# Patient Record
Sex: Female | Born: 1945 | Race: White | Hispanic: No | Marital: Single | Smoking: Former smoker
Health system: Southern US, Community
[De-identification: ages and names within clinical notes are randomized; demographics above are authoritative.]

---

## 2017-12-30 DIAGNOSIS — R7302 Impaired glucose tolerance (oral): Secondary | ICD-10-CM | POA: Insufficient documentation

## 2019-12-17 DIAGNOSIS — J439 Emphysema, unspecified: Secondary | ICD-10-CM | POA: Insufficient documentation

## 2019-12-21 ENCOUNTER — Other Ambulatory Visit: Payer: Self-pay | Admitting: Internal Medicine

## 2019-12-21 DIAGNOSIS — Z1231 Encounter for screening mammogram for malignant neoplasm of breast: Secondary | ICD-10-CM

## 2019-12-30 DIAGNOSIS — M810 Age-related osteoporosis without current pathological fracture: Secondary | ICD-10-CM | POA: Insufficient documentation

## 2020-01-25 ENCOUNTER — Ambulatory Visit
Admission: RE | Admit: 2020-01-25 | Discharge: 2020-01-25 | Disposition: A | Discharge status: Home / Self Care | Payer: Medicare Other | Source: Ambulatory Visit | Attending: Internal Medicine | Admitting: Internal Medicine

## 2020-01-25 ENCOUNTER — Encounter (INDEPENDENT_AMBULATORY_CARE_PROVIDER_SITE_OTHER): Payer: Self-pay

## 2020-01-25 ENCOUNTER — Other Ambulatory Visit: Payer: Self-pay

## 2020-01-25 DIAGNOSIS — Z1231 Encounter for screening mammogram for malignant neoplasm of breast: Secondary | ICD-10-CM | POA: Insufficient documentation

## 2020-02-17 ENCOUNTER — Other Ambulatory Visit: Payer: Self-pay | Admitting: Internal Medicine

## 2020-02-17 DIAGNOSIS — N6310 Unspecified lump in the right breast, unspecified quadrant: Secondary | ICD-10-CM

## 2020-02-17 DIAGNOSIS — R928 Other abnormal and inconclusive findings on diagnostic imaging of breast: Secondary | ICD-10-CM

## 2020-02-19 ENCOUNTER — Ambulatory Visit
Admission: RE | Admit: 2020-02-19 | Discharge: 2020-02-19 | Disposition: A | Discharge status: Home / Self Care | Payer: Medicare Other | Source: Ambulatory Visit | Attending: Internal Medicine | Admitting: Internal Medicine

## 2020-02-19 DIAGNOSIS — R928 Other abnormal and inconclusive findings on diagnostic imaging of breast: Secondary | ICD-10-CM | POA: Insufficient documentation

## 2020-02-19 DIAGNOSIS — N6310 Unspecified lump in the right breast, unspecified quadrant: Secondary | ICD-10-CM

## 2020-02-24 ENCOUNTER — Other Ambulatory Visit: Payer: Self-pay | Admitting: Internal Medicine

## 2020-02-24 DIAGNOSIS — R928 Other abnormal and inconclusive findings on diagnostic imaging of breast: Secondary | ICD-10-CM

## 2020-02-24 DIAGNOSIS — N6310 Unspecified lump in the right breast, unspecified quadrant: Secondary | ICD-10-CM

## 2020-02-26 ENCOUNTER — Ambulatory Visit
Admission: RE | Admit: 2020-02-26 | Discharge: 2020-02-26 | Disposition: A | Discharge status: Home / Self Care | Payer: Medicare Other | Source: Ambulatory Visit | Attending: Internal Medicine | Admitting: Internal Medicine

## 2020-02-26 DIAGNOSIS — R928 Other abnormal and inconclusive findings on diagnostic imaging of breast: Secondary | ICD-10-CM | POA: Insufficient documentation

## 2020-02-26 DIAGNOSIS — N6310 Unspecified lump in the right breast, unspecified quadrant: Secondary | ICD-10-CM | POA: Insufficient documentation

## 2020-02-26 HISTORY — PX: BREAST BIOPSY: SHX20

## 2020-02-29 LAB — SURGICAL PATHOLOGY

## 2020-11-03 DIAGNOSIS — F5101 Primary insomnia: Secondary | ICD-10-CM | POA: Insufficient documentation

## 2020-12-19 ENCOUNTER — Other Ambulatory Visit: Payer: Self-pay | Admitting: Internal Medicine

## 2020-12-19 DIAGNOSIS — Z1231 Encounter for screening mammogram for malignant neoplasm of breast: Secondary | ICD-10-CM

## 2021-02-06 ENCOUNTER — Ambulatory Visit
Admission: RE | Admit: 2021-02-06 | Discharge: 2021-02-06 | Disposition: A | Discharge status: Home / Self Care | Payer: Medicare Other | Source: Ambulatory Visit | Attending: Internal Medicine | Admitting: Internal Medicine

## 2021-02-06 ENCOUNTER — Other Ambulatory Visit: Payer: Self-pay

## 2021-02-06 DIAGNOSIS — Z1231 Encounter for screening mammogram for malignant neoplasm of breast: Secondary | ICD-10-CM | POA: Insufficient documentation

## 2021-03-03 ENCOUNTER — Encounter: Payer: Medicare Other | Attending: Internal Medicine | Admitting: *Deleted

## 2021-03-03 ENCOUNTER — Other Ambulatory Visit: Payer: Self-pay

## 2021-03-03 DIAGNOSIS — J9621 Acute and chronic respiratory failure with hypoxia: Secondary | ICD-10-CM | POA: Insufficient documentation

## 2021-03-03 DIAGNOSIS — J449 Chronic obstructive pulmonary disease, unspecified: Secondary | ICD-10-CM | POA: Insufficient documentation

## 2021-03-03 DIAGNOSIS — E782 Mixed hyperlipidemia: Secondary | ICD-10-CM | POA: Insufficient documentation

## 2021-03-03 NOTE — Progress Notes (Signed)
Initial telephone orientation completed. Diagnosis can be found in [facility] 5/16. EP orientation scheduled for Thursday 6/23 at 2pm.

## 2021-03-09 ENCOUNTER — Other Ambulatory Visit: Payer: Self-pay

## 2021-03-09 DIAGNOSIS — J449 Chronic obstructive pulmonary disease, unspecified: Secondary | ICD-10-CM

## 2021-03-09 NOTE — Progress Notes (Signed)
Incomplete Session Note  Patient Details  Name: Anna Mooney MRN: [id number] Date of Birth: 1946/01/11 Referring Provider:    Harrold Donath did not complete her rehab session.  Patient had on sandals and could not complete exercise session.

## 2021-03-13 ENCOUNTER — Other Ambulatory Visit: Payer: Self-pay

## 2021-03-13 VITALS — Ht 66.0 in | Wt 139.9 lb

## 2021-03-13 DIAGNOSIS — J9621 Acute and chronic respiratory failure with hypoxia: Secondary | ICD-10-CM | POA: Diagnosis not present

## 2021-03-13 DIAGNOSIS — J449 Chronic obstructive pulmonary disease, unspecified: Secondary | ICD-10-CM

## 2021-03-13 NOTE — Patient Instructions (Signed)
Patient Instructions  Patient Details  Name: Anna Mooney MRN: [id number] Date of Birth: November 15, 1945 Referring Provider:  Warnell Forester, MD  Below are your personal goals for exercise, nutrition, and risk factors. Our goal is to help you stay on track towards obtaining and maintaining these goals. We will be discussing your progress on these goals with you throughout the program.  Initial Exercise Prescription:  Initial Exercise Prescription - 03/13/21 1400       Date of Initial Exercise RX and Referring Provider   Date 03/13/21    Referring Provider Mosher      Oxygen   Oxygen Continuous    Liters 2      Treadmill   MPH 1.3    Grade 0    Minutes 15   rest as needed   METs 2      Recumbant Bike   Level 1    RPM 60    Minutes 15    METs 2      NuStep   Level 1    SPM 80    Minutes 15    METs 2      T5 Nustep   Level 1    SPM 80    Minutes 15    METs 2      Biostep-RELP   Level 1    SPM 50    Minutes 15    METs 2      Prescription Details   Frequency (times per week) 3    Duration Progress to 30 minutes of continuous aerobic without signs/symptoms of physical distress      Intensity   THRR 40-80% of Max Heartrate 131-141    Ratings of Perceived Exertion 11-13    Perceived Dyspnea 0-4      Resistance Training   Training Prescription Yes    Weight 3 lb    Reps 10-15             Exercise Goals: Frequency: Be able to perform aerobic exercise two to three times per week in program working toward 2-5 days per week of home exercise.  Intensity: Work with a perceived exertion of 11 (fairly light) - 15 (hard) while following your exercise prescription.  We will make changes to your prescription with you as you progress through the program.   Duration: Be able to do 30 to 45 minutes of continuous aerobic exercise in addition to a 5 minute warm-up and a 5 minute cool-down routine.   Nutrition Goals: Your personal nutrition goals will be established  when you do your nutrition analysis with the dietician.  The following are general nutrition guidelines to follow: Cholesterol < 200mg /day Sodium < 1500mg /day Fiber: Women under 50 yrs - 25 grams per day  Personal Goals:  Personal Goals and Risk Factors at Admission - 03/13/21 1405       Core Components/Risk Factors/Patient Goals on Admission   Improve shortness of breath with ADL's Yes    Intervention Provide education, individualized exercise plan and daily activity instruction to help decrease symptoms of SOB with activities of daily living.    Expected Outcomes Short Term: Improve cardiorespiratory fitness to achieve a reduction of symptoms when performing ADLs;Long Term: Be able to perform more ADLs without symptoms or delay the onset of symptoms    Increase knowledge of respiratory medications and ability to use respiratory devices properly  Yes    Intervention Provide education and demonstration as needed of appropriate use of medications, inhalers, and oxygen therapy.  Expected Outcomes Short Term: Achieves understanding of medications use. Understands that oxygen is a medication prescribed by physician. Demonstrates appropriate use of inhaler and oxygen therapy.;Long Term: Maintain appropriate use of medications, inhalers, and oxygen therapy.             Tobacco Use Initial Evaluation: Social History   Tobacco Use  Smoking Status Former   Pack years: 0.00   Types: Cigarettes   Quit date: 2000   Years since quitting: 22.5  Smokeless Tobacco Never    Exercise Goals and Review:  Exercise Goals     Row Name 03/13/21 1409             Exercise Goals   Increase Physical Activity Yes       Intervention Provide advice, education, support and counseling about physical activity/exercise needs.;Develop an individualized exercise prescription for aerobic and resistive training based on initial evaluation findings, risk stratification, comorbidities and participant's  personal goals.       Expected Outcomes Short Term: Attend rehab on a regular basis to increase amount of physical activity.;Long Term: Add in home exercise to make exercise part of routine and to increase amount of physical activity.;Long Term: Exercising regularly at least 3-5 days a week.       Increase Strength and Stamina Yes       Intervention Provide advice, education, support and counseling about physical activity/exercise needs.;Develop an individualized exercise prescription for aerobic and resistive training based on initial evaluation findings, risk stratification, comorbidities and participant's personal goals.       Expected Outcomes Short Term: Increase workloads from initial exercise prescription for resistance, speed, and METs.;Short Term: Perform resistance training exercises routinely during rehab and add in resistance training at home;Long Term: Improve cardiorespiratory fitness, muscular endurance and strength as measured by increased METs and functional capacity ( )       Able to understand and use rate of perceived exertion (RPE) scale Yes       Intervention Provide education and explanation on how to use RPE scale       Expected Outcomes Short Term: Able to use RPE daily in rehab to express subjective intensity level;Long Term:  Able to use RPE to guide intensity level when exercising independently       Able to understand and use Dyspnea scale Yes       Intervention Provide education and explanation on how to use Dyspnea scale       Expected Outcomes Short Term: Able to use Dyspnea scale daily in rehab to express subjective sense of shortness of breath during exertion;Long Term: Able to use Dyspnea scale to guide intensity level when exercising independently       Knowledge and understanding of Target Heart Rate Range (THRR) Yes       Intervention Provide education and explanation of THRR including how the numbers were predicted and where they are located for reference        Expected Outcomes Short Term: Able to state/look up THRR;Long Term: Able to use THRR to govern intensity when exercising independently;Short Term: Able to use daily as guideline for intensity in rehab       Able to check pulse independently Yes       Intervention Provide education and demonstration on how to check pulse in carotid and radial arteries.;Review the importance of being able to check your own pulse for safety during independent exercise       Expected Outcomes Short Term: Able to explain why pulse checking  is important during independent exercise       Understanding of Exercise Prescription Yes       Intervention Provide education, explanation, and written materials on patient's individual exercise prescription       Expected Outcomes Short Term: Able to explain program exercise prescription;Long Term: Able to explain home exercise prescription to exercise independently       Improve claudication pain toleration; Improve walking ability Yes       Intervention Participate in PAD/SET Rehab 2-3 days a week, walking at home as part of exercise prescription;Attend education sessions to aid in risk factor modification and understanding of disease process       Expected Outcomes Short Term: Improve walking distance/time to onset of claudication pain;Long Term: Improve score of PAD questionnaires                Copy of goals given to participant.

## 2021-03-13 NOTE — Progress Notes (Signed)
Daily Session Note  Patient Details  Name: Anna Mooney MRN: [id number] Date of Birth: 1945-10-29 Referring Provider:   Flowsheet Row Pulmonary Rehab from 03/13/2021 in [facility]  Referring Provider Mosher       Encounter Date: 03/13/2021  Check In:  Session Check In - 03/13/21 1417       Check-In   Supervising physician immediately available to respond to emergencies See telemetry face sheet for immediately available ER MD    Location [facility]-Cardiac & Pulmonary Rehab    Staff Present Birdie Sons, MPA, [place]icia Area, BS, ACSM CEP, Exercise Physiologist;Kara Eliezer Bottom, MS, ASCM CEP, Exercise Physiologist    Virtual Visit No    Medication changes reported     No    Fall or balance concerns reported    No    Warm-up and Cool-down Performed on first and last piece of equipment    Resistance Training Performed Yes    VAD Patient? No    PAD/SET Patient? No      Pain Assessment   Currently in Pain? No/denies               Exercise Prescription Changes - 03/13/21 1400       Response to Exercise   Blood Pressure (Admit) 140/78    Blood Pressure (Exercise) 154/84    Blood Pressure (Exit) 118/80    Heart Rate (Admit) 121 bpm    Heart Rate (Exercise) 132 bpm    Heart Rate (Exit) 121 bpm    Oxygen Saturation (Admit) 96 %    Oxygen Saturation (Exercise) 92 %    Oxygen Saturation (Exit) 96 %    Rating of Perceived Exertion (Exercise) 17    Perceived Dyspnea (Exercise) 3    Symptoms SOB             Social History   Tobacco Use  Smoking Status Former   Pack years: 0.00   Types: Cigarettes   Quit date: 2000   Years since quitting: 22.5  Smokeless Tobacco Never    Goals Met:  Independence with exercise equipment Exercise tolerated well No report of cardiac concerns or symptoms Strength training completed today  Goals Unmet:  Not Applicable  Comments: First full day of exercise!  Patient was oriented to gym and equipment including  functions, settings, policies, and procedures.  Patient's individual exercise prescription and treatment plan were reviewed.  All starting workloads were established based on the results of the 6 minute walk test done at initial orientation visit.  The plan for exercise progression was also introduced and progression will be customized based on patient's performance and goals.    Dr. Emily Filbert is Medical Director for [facility].  Dr. Ottie Glazier is Medical Director for [facility] Pulmonary [facility].

## 2021-03-13 NOTE — Progress Notes (Signed)
Pulmonary Individual Treatment Plan  Patient Details  Name: Anna Mooney MRN: [id number] Date of Birth: 1946-03-01 Referring Provider:   Flowsheet Row Pulmonary Rehab from 03/13/2021 in [facility] Cardiac and Pulmonary Rehab  Referring Provider Mosher       Initial Encounter Date:  Flowsheet Row Pulmonary Rehab from 03/13/2021 in [facility] Cardiac and Pulmonary Rehab  Date 03/13/21       Visit Diagnosis: No diagnosis found.  Patient's Home Medications on Admission:  Current Outpatient Medications:    albuterol (VENTOLIN HFA) 108 (90 Base) MCG/ACT inhaler, Inhale into the lungs., Disp: , Rfl:    Calcium Carbonate-Vitamin D 600-200 MG-UNIT TABS, Take 1 tablet by mouth daily., Disp: , Rfl:    ibuprofen (ADVIL) 200 MG tablet, Take by mouth., Disp: , Rfl:    ipratropium-albuterol (DUONEB) 0.5-2.5 (3) MG/3ML SOLN, Inhale into the lungs., Disp: , Rfl:    Melatonin 10 MG TABS, Take by mouth., Disp: , Rfl:    montelukast (SINGULAIR) 10 MG tablet, SMARTSIG:1 Tablet(s) By Mouth Every Evening, Disp: , Rfl:    TRELEGY ELLIPTA 100-62.5-25 MCG/INH AEPB, Inhale 1 puff into the lungs daily., Disp: , Rfl:   Past Medical History: No past medical history on file.  Tobacco Use: Social History   Tobacco Use  Smoking Status Former   Pack years: 0.00   Types: Cigarettes   Quit date: 2000   Years since quitting: 22.5  Smokeless Tobacco Never    Labs: Recent Review Flowsheet Data   There is no flowsheet data to display.      Pulmonary Assessment Scores:  Pulmonary Assessment Scores     Row Name 03/09/21 1513 03/13/21 1403       ADL UCSD   SOB Score total 80 --    Rest 1 --    Walk 4 --    Stairs 5 --    Bath 4 --    Dress 3 --    Shop 4 --         CAT Score      CAT Score -- 18         mMRC Score      mMRC Score -- 3            UCSD: Self-administered rating of dyspnea associated with activities of daily living (ADLs) 6-point scale (0 = "not at all" to 5 = "maximal or unable  to do because of breathlessness")  Scoring Scores range from 0 to 120.  Minimally important difference is 5 units  CAT: CAT can identify the health impairment of COPD patients and is better correlated with disease progression.  CAT has a scoring range of zero to 40. The CAT score is classified into four groups of low (less than 10), medium (10 - 20), high (21-30) and very high (31-40) based on the impact level of disease on health status. A CAT score over 10 suggests significant symptoms.  A worsening CAT score could be explained by an exacerbation, poor medication adherence, poor inhaler technique, or progression of COPD or comorbid conditions.  CAT MCID is 2 points  mMRC: mMRC (Modified Medical Research Council) Dyspnea Scale is used to assess the degree of baseline functional disability in patients of respiratory disease due to dyspnea. No minimal important difference is established. A decrease in score of 1 point or greater is considered a positive change.   Pulmonary Function Assessment:   Exercise Target Goals: Exercise Program Goal: Individual exercise prescription set using results from initial 6 min walk  test and THRR while considering  patient's activity barriers and safety.   Exercise Prescription Goal: Initial exercise prescription builds to 30-45 minutes a day of aerobic activity, 2-3 days per week.  Home exercise guidelines will be given to patient during program as part of exercise prescription that the participant will acknowledge.  Education: Aerobic Exercise: - Group verbal and visual presentation on the components of exercise prescription. Introduces F.I.T.T principle from ACSM for exercise prescriptions.  Reviews F.I.T.T. principles of aerobic exercise including progression. Written material given at graduation.   Education: Resistance Exercise: - Group verbal and visual presentation on the components of exercise prescription. Introduces F.I.T.T principle from ACSM for  exercise prescriptions  Reviews F.I.T.T. principles of resistance exercise including progression. Written material given at graduation.    Education: Exercise & Equipment Safety: - Individual verbal instruction and demonstration of equipment use and safety with use of the equipment. Flowsheet Row [facility]monary [facility]  Date 03/09/21  Educator AS  Instruction Review Code 1- Verbalizes Understanding       Education: Exercise Physiology & General Exercise Guidelines: - Group verbal and written instruction with models to review the exercise physiology of the cardiovascular system and associated critical values. Provides general exercise guidelines with specific guidelines to those with heart or lung disease.    Education: Flexibility, Balance, Mind/Body Relaxation: - Group verbal and visual presentation with interactive activity on the components of exercise prescription. Introduces F.I.T.T principle from ACSM for exercise prescriptions. Reviews F.I.T.T. principles of flexibility and balance exercise training including progression. Also discusses the mind body connection.  Reviews various relaxation techniques to help reduce and manage stress (i.e. Deep breathing, progressive muscle relaxation, and visualization). Balance handout provided to take home. Written material given at graduation.   Activity Barriers & Risk Stratification:  Activity Barriers & Cardiac Risk Stratification - 03/03/21 1307       Activity Barriers & Cardiac Risk Stratification   Activity Barriers None             6 Minute Walk:  6 Minute Walk     Row Name 03/13/21 1403         6 Minute Walk   Phase Initial     Distance 500 feet     Walk Time 4.5 minutes     # of Rest Breaks 2     MPH 1.3     METS 2     RPE 17     Perceived Dyspnea  3     VO2 Peak 7.3     Symptoms Yes (comment)     Comments SOB     Resting HR 121 bpm     Resting BP 140/78     Resting  Oxygen Saturation  96 %     Exercise Oxygen Saturation  during 6 min walk 92 %     Max Ex. HR 132 bpm     Max Ex. BP 154/84     2 Minute Post BP 118/80           Interval HR     1 Minute HR 121     2 Minute HR 129     3 Minute HR 132     4 Minute HR 127     5 Minute HR 124     6 Minute HR 126     2 Minute Post HR 121     Interval Heart Rate? Yes  Interval Oxygen     Interval Oxygen? Yes     Baseline Oxygen Saturation % 96 %     1 Minute Oxygen Saturation % 95 %     1 Minute Liters of Oxygen 2 L     2 Minute Oxygen Saturation % 93 %     2 Minute Liters of Oxygen 2 L     3 Minute Oxygen Saturation % 92 %     3 Minute Liters of Oxygen 2 L     4 Minute Oxygen Saturation % 94 %     4 Minute Liters of Oxygen 2 L     5 Minute Oxygen Saturation % 93 %     5 Minute Liters of Oxygen 2 L     6 Minute Oxygen Saturation % 92 %     6 Minute Liters of Oxygen 2 L     2 Minute Post Oxygen Saturation % 96 %     2 Minute Post Liters of Oxygen 2 L            Oxygen Initial Assessment:  Oxygen Initial Assessment - 03/03/21 1305       Home Oxygen   Home Oxygen Device Home Concentrator;Portable Concentrator    Sleep Oxygen Prescription Continuous    Liters per minute 2    Home Exercise Oxygen Prescription Continuous    Liters per minute 2    Home Resting Oxygen Prescription Continuous    Liters per minute 2    Compliance with Home Oxygen Use Yes      Intervention   Short Term Goals To learn and exhibit compliance with exercise, home and travel O2 prescription;To learn and understand importance of monitoring SPO2 with pulse oximeter and demonstrate accurate use of the pulse oximeter.;To learn and understand importance of maintaining oxygen saturations>88%;To learn and demonstrate proper pursed lip breathing techniques or other breathing techniques. ;To learn and demonstrate proper use of respiratory medications    Long  Term Goals Exhibits compliance with exercise, home  and  travel O2 prescription;Verbalizes importance of monitoring SPO2 with pulse oximeter and return demonstration;Maintenance of O2 saturations>88%;Exhibits proper breathing techniques, such as pursed lip breathing or other method taught during program session;Compliance with respiratory medication;Demonstrates proper use of MDI's             Oxygen Re-Evaluation:   Oxygen Discharge (Final Oxygen Re-Evaluation):   Initial Exercise Prescription:  Initial Exercise Prescription - 03/13/21 1400       Date of Initial Exercise RX and Referring Provider   Date 03/13/21    Referring Provider Mosher      Oxygen   Oxygen Continuous    Liters 2      Treadmill   MPH 1.3    Grade 0    Minutes 15   rest as needed   METs 2      Recumbant Bike   Level 1    RPM 60    Minutes 15    METs 2      NuStep   Level 1    SPM 80    Minutes 15    METs 2      T5 Nustep   Level 1    SPM 80    Minutes 15    METs 2      Biostep-RELP   Level 1    SPM 50    Minutes 15    METs 2      Prescription Details  Frequency (times per week) 3    Duration Progress to 30 minutes of continuous aerobic without signs/symptoms of physical distress      Intensity   THRR 40-80% of Max Heartrate 131-141    Ratings of Perceived Exertion 11-13    Perceived Dyspnea 0-4      Resistance Training   Training Prescription Yes    Weight 3 lb    Reps 10-15             Perform Capillary Blood Glucose checks as needed.  Exercise Prescription Changes:   Exercise Prescription Changes     Row Name 03/13/21 1400             Response to Exercise   Blood Pressure (Admit) 140/78       Blood Pressure (Exercise) 154/84       Blood Pressure (Exit) 118/80       Heart Rate (Admit) 121 bpm       Heart Rate (Exercise) 132 bpm       Heart Rate (Exit) 121 bpm       Oxygen Saturation (Admit) 96 %       Oxygen Saturation (Exercise) 92 %       Oxygen Saturation (Exit) 96 %       Rating of Perceived  Exertion (Exercise) 17       Perceived Dyspnea (Exercise) 3       Symptoms SOB                Exercise Comments:   Exercise Goals and Review:   Exercise Goals     Row Name 03/13/21 1409             Exercise Goals   Increase Physical Activity Yes       Intervention Provide advice, education, support and counseling about physical activity/exercise needs.;Develop an individualized exercise prescription for aerobic and resistive training based on initial evaluation findings, risk stratification, comorbidities and participant's personal goals.       Expected Outcomes Short Term: Attend rehab on a regular basis to increase amount of physical activity.;Long Term: Add in home exercise to make exercise part of routine and to increase amount of physical activity.;Long Term: Exercising regularly at least 3-5 days a week.       Increase Strength and Stamina Yes       Intervention Provide advice, education, support and counseling about physical activity/exercise needs.;Develop an individualized exercise prescription for aerobic and resistive training based on initial evaluation findings, risk stratification, comorbidities and participant's personal goals.       Expected Outcomes Short Term: Increase workloads from initial exercise prescription for resistance, speed, and METs.;Short Term: Perform resistance training exercises routinely during rehab and add in resistance training at home;Long Term: Improve cardiorespiratory fitness, muscular endurance and strength as measured by increased METs and functional capacity ( )       Able to understand and use rate of perceived exertion (RPE) scale Yes       Intervention Provide education and explanation on how to use RPE scale       Expected Outcomes Short Term: Able to use RPE daily in rehab to express subjective intensity level;Long Term:  Able to use RPE to guide intensity level when exercising independently       Able to understand and use Dyspnea  scale Yes       Intervention Provide education and explanation on how to use Dyspnea scale       Expected Outcomes  Short Term: Able to use Dyspnea scale daily in rehab to express subjective sense of shortness of breath during exertion;Long Term: Able to use Dyspnea scale to guide intensity level when exercising independently       Knowledge and understanding of Target Heart Rate Range (THRR) Yes       Intervention Provide education and explanation of THRR including how the numbers were predicted and where they are located for reference       Expected Outcomes Short Term: Able to state/look up THRR;Long Term: Able to use THRR to govern intensity when exercising independently;Short Term: Able to use daily as guideline for intensity in rehab       Able to check pulse independently Yes       Intervention Provide education and demonstration on how to check pulse in carotid and radial arteries.;Review the importance of being able to check your own pulse for safety during independent exercise       Expected Outcomes Short Term: Able to explain why pulse checking is important during independent exercise       Understanding of Exercise Prescription Yes       Intervention Provide education, explanation, and written materials on patient's individual exercise prescription       Expected Outcomes Short Term: Able to explain program exercise prescription;Long Term: Able to explain home exercise prescription to exercise independently       Improve claudication pain toleration; Improve walking ability Yes       Intervention Participate in PAD/SET Rehab 2-3 days a week, walking at home as part of exercise prescription;Attend education sessions to aid in risk factor modification and understanding of disease process       Expected Outcomes Short Term: Improve walking distance/time to onset of claudication pain;Long Term: Improve score of PAD questionnaires                Exercise Goals Re-Evaluation  :   Discharge Exercise Prescription (Final Exercise Prescription Changes):  Exercise Prescription Changes - 03/13/21 1400       Response to Exercise   Blood Pressure (Admit) 140/78    Blood Pressure (Exercise) 154/84    Blood Pressure (Exit) 118/80    Heart Rate (Admit) 121 bpm    Heart Rate (Exercise) 132 bpm    Heart Rate (Exit) 121 bpm    Oxygen Saturation (Admit) 96 %    Oxygen Saturation (Exercise) 92 %    Oxygen Saturation (Exit) 96 %    Rating of Perceived Exertion (Exercise) 17    Perceived Dyspnea (Exercise) 3    Symptoms SOB             Nutrition:  Target Goals: Understanding of nutrition guidelines, daily intake of sodium 1500mg , cholesterol 200mg , calories 30% from fat and 7% or less from saturated fats, daily to have 5 or more servings of fruits and vegetables.  Education: All About Nutrition: -Group instruction provided by verbal, written material, interactive activities, discussions, models, and posters to present general guidelines for heart healthy nutrition including fat, fiber, MyPlate, the role of sodium in heart healthy nutrition, utilization of the nutrition label, and utilization of this knowledge for meal planning. Follow up email sent as well. Written material given at graduation.   Biometrics:  Pre Biometrics - 03/13/21 1410       Pre Biometrics   Height  (1.676 m)    Weight 139 lb 14.4 oz (63.5 kg)    BMI (Calculated) 22.59    Single Leg  Stand 6.34 seconds              Nutrition Therapy Plan and Nutrition Goals:   Nutrition Assessments:  MEDIFICTS Score Key: ?70 Need to make dietary changes  40-70 Heart Healthy Diet ? 40 Therapeutic Level Cholesterol Diet  Flowsheet Row Pulmonary Rehab from 03/09/2021 in [facility] Cardiac and Pulmonary Rehab  Picture Your Plate Total Score on Admission 45      Picture Your Plate Scores: <00 Unhealthy dietary pattern with much room for improvement. 41-50 Dietary pattern unlikely to meet  recommendations for good health and room for improvement. 51-60 More healthful dietary pattern, with some room for improvement.  >60 Healthy dietary pattern, although there may be some specific behaviors that could be improved.   Nutrition Goals Re-Evaluation:   Nutrition Goals Discharge (Final Nutrition Goals Re-Evaluation):   Psychosocial: Target Goals: Acknowledge presence or absence of significant depression and/or stress, maximize coping skills, provide positive support system. Participant is able to verbalize types and ability to use techniques and skills needed for reducing stress and depression.   Education: Stress, Anxiety, and Depression - Group verbal and visual presentation to define topics covered.  Reviews how body is impacted by stress, anxiety, and depression.  Also discusses healthy ways to reduce stress and to treat/manage anxiety and depression.  Written material given at graduation.   Education: Sleep Hygiene -Provides group verbal and written instruction about how sleep can affect your health.  Define sleep hygiene, discuss sleep cycles and impact of sleep habits. Review good sleep hygiene tips.    Initial Review & Psychosocial Screening:  Initial Psych Review & Screening - 03/03/21 1312       Initial Review   Current issues with Current Stress Concerns;Current Sleep Concerns    Source of Stress Concerns Unable to participate in former interests or hobbies;Unable to perform yard/household activities      Family Dynamics   Good Support System? Yes   nieces, nephews, brother     Barriers   Psychosocial barriers to participate in program There are no identifiable barriers or psychosocial needs.      Screening Interventions   Interventions Encouraged to exercise;Provide feedback about the scores to participant;To provide support and resources with identified psychosocial needs    Expected Outcomes Short Term goal: Utilizing psychosocial counselor, staff and  physician to assist with identification of specific Stressors or current issues interfering with healing process. Setting desired goal for each stressor or current issue identified.;Long Term Goal: Stressors or current issues are controlled or eliminated.;Short Term goal: Identification and review with participant of any Quality of Life or Depression concerns found by scoring the questionnaire.;Long Term goal: The participant improves quality of Life and PHQ9 Scores as seen by post scores and/or verbalization of changes             Quality of Life Scores:  Scores of 19 and below usually indicate a poorer quality of life in these areas.  A difference of  2-3 points is a clinically meaningful difference.  A difference of 2-3 points in the total score of the Quality of Life Index has been associated with significant improvement in overall quality of life, self-image, physical symptoms, and general health in studies assessing change in quality of life.  PHQ-9: Recent Review Flowsheet Data     Depression screen [facility] 2/9 03/09/2021   Decreased Interest 1   Down, Depressed, Hopeless 0   PHQ - 2 Score 1   Altered sleeping 3   Tired,  decreased energy 3   Change in appetite 2   Feeling bad or failure about yourself  0   Trouble concentrating 0   Moving slowly or fidgety/restless 0   Suicidal thoughts 0   PHQ-9 Score 9      Interpretation of Total Score  Total Score Depression Severity:  1-4 = Minimal depression, 5-9 = Mild depression, 10-14 = Moderate depression, 15-19 = Moderately severe depression, 20-27 = Severe depression   Psychosocial Evaluation and Intervention:  Psychosocial Evaluation - 03/03/21 1319       Psychosocial Evaluation & Interventions   Interventions Encouraged to exercise with the program and follow exercise prescription    Comments Ms. Illingworth is coming to pulmonary rehab for COPD in hopes this will help her breathing. She lives alone, with nieces, nephews and a  brother close by, and wishes to maintain her independence as long as possible. She started wearing oxygen in March and is compliant with it. Her appetite is a struggle in that when she finally is hungry and makes food she only eats a little of it. She states she feels like her stress has gone down now that one of her brothers is no longer suffering with dementia- which was very difficult to watch for her. She does wish she could do more than just sit at home and is hopeful this program will change that.    Expected Outcomes Short: attend pulmonary rehab for education and exercise. Long: develop positive health care habits.             Psychosocial Re-Evaluation:   Psychosocial Discharge (Final Psychosocial Re-Evaluation):   Education: Education Goals: Education classes will be provided on a weekly basis, covering required topics. Participant will state understanding/return demonstration of topics presented.  Learning Barriers/Preferences:  Learning Barriers/Preferences - 03/03/21 1314       Learning Barriers/Preferences   Learning Barriers None    Learning Preferences None             General Pulmonary Education Topics:  Infection Prevention: - Provides verbal and written material to individual with discussion of infection control including proper hand washing and proper equipment cleaning during exercise session. Flowsheet Row Pulmonary Rehab from 03/09/2021 in [facility] Cardiac and Pulmonary Rehab  Date 03/09/21  Educator AS  Instruction Review Code 1- Verbalizes Understanding       Falls Prevention: - Provides verbal and written material to individual with discussion of falls prevention and safety.   Chronic Lung Disease Review: - Group verbal instruction with posters, models, PowerPoint presentations and videos,  to review new updates, new respiratory medications, new advancements in procedures and treatments. Providing information on websites and "800" numbers for  continued self-education. Includes information about supplement oxygen, available portable oxygen systems, continuous and intermittent flow rates, oxygen safety, concentrators, and Medicare reimbursement for oxygen. Explanation of Pulmonary Drugs, including class, frequency, complications, importance of spacers, rinsing mouth after steroid MDI's, and proper cleaning methods for nebulizers. Review of basic lung anatomy and physiology related to function, structure, and complications of lung disease. Review of risk factors. Discussion about methods for diagnosing sleep apnea and types of masks and machines for OSA. Includes a review of the use of types of environmental controls: home humidity, furnaces, filters, dust mite/pet prevention, HEPA vacuums. Discussion about weather changes, air quality and the benefits of nasal washing. Instruction on Warning signs, infection symptoms, calling MD promptly, preventive modes, and value of vaccinations. Review of effective airway clearance, coughing and/or vibration techniques. Emphasizing that  all should Create an Action Plan. Written material given at graduation.   AED/CPR: - Group verbal and written instruction with the use of models to demonstrate the basic use of the AED with the basic ABC's of resuscitation.    Anatomy and Cardiac Procedures: - Group verbal and visual presentation and models provide information about basic cardiac anatomy and function. Reviews the testing methods done to diagnose heart disease and the outcomes of the test results. Describes the treatment choices: Medical Management, Angioplasty, or Coronary Bypass Surgery for treating various heart conditions including Myocardial Infarction, Angina, Valve Disease, and Cardiac Arrhythmias.  Written material given at graduation.   Medication Safety: - Group verbal and visual instruction to review commonly prescribed medications for heart and lung disease. Reviews the medication, class of the  drug, and side effects. Includes the steps to properly store meds and maintain the prescription regimen.  Written material given at graduation.   Other: -Provides group and verbal instruction on various topics (see comments)   Knowledge Questionnaire Score:    Core Components/Risk Factors/Patient Goals at Admission:  Personal Goals and Risk Factors at Admission - 03/13/21 1405       Core Components/Risk Factors/Patient Goals on Admission   Improve shortness of breath with ADL's Yes    Intervention Provide education, individualized exercise plan and daily activity instruction to help decrease symptoms of SOB with activities of daily living.    Expected Outcomes Short Term: Improve cardiorespiratory fitness to achieve a reduction of symptoms when performing ADLs;Long Term: Be able to perform more ADLs without symptoms or delay the onset of symptoms    Increase knowledge of respiratory medications and ability to use respiratory devices properly  Yes    Intervention Provide education and demonstration as needed of appropriate use of medications, inhalers, and oxygen therapy.    Expected Outcomes Short Term: Achieves understanding of medications use. Understands that oxygen is a medication prescribed by physician. Demonstrates appropriate use of inhaler and oxygen therapy.;Long Term: Maintain appropriate use of medications, inhalers, and oxygen therapy.             Education:Diabetes - Individual verbal and written instruction to review signs/symptoms of diabetes, desired ranges of glucose level fasting, after meals and with exercise. Acknowledge that pre and post exercise glucose checks will be done for 3 sessions at entry of program.   Know Your Numbers and Heart Failure: - Group verbal and visual instruction to discuss disease risk factors for cardiac and pulmonary disease and treatment options.  Reviews associated critical values for Overweight/Obesity, Hypertension, Cholesterol, and  Diabetes.  Discusses basics of heart failure: signs/symptoms and treatments.  Introduces Heart Failure Zone chart for action plan for heart failure.  Written material given at graduation.   Core Components/Risk Factors/Patient Goals Review:    Core Components/Risk Factors/Patient Goals at Discharge (Final Review):    ITP Comments:  ITP Comments     Row Name 03/03/21 1324           ITP Comments Initial telephone orientation completed. Diagnosis can be found in [facility] 5/16. EP orientation scheduled for Thursday 6/23 at 2pm.                Comments: initial ITP

## 2021-03-15 ENCOUNTER — Other Ambulatory Visit: Payer: Self-pay

## 2021-03-15 DIAGNOSIS — J449 Chronic obstructive pulmonary disease, unspecified: Secondary | ICD-10-CM

## 2021-03-15 NOTE — Progress Notes (Signed)
Daily Session Note  Patient Details  Name: Anna Mooney MRN: [id number] Date of Birth: August 24, 1946 Referring Provider:   Flowsheet Row Pulmonary Rehab from 03/13/2021 in [facility] Cardiac and Pulmonary Rehab  Referring Provider [place]       Encounter Date: 03/15/2021  Check In:  Session Check In - 03/15/21 1354       Check-In   Supervising physician immediately available to respond to emergencies See telemetry face sheet for immediately available ER MD    Location [facility]-Cardiac & Pulmonary Rehab    Staff Present Birdie Sons, MPA, RN;Elaria Osias, RN,BC,MSN;Joseph Lou Miner, MS, ASCM CEP, Exercise Physiologist    Virtual Visit No    Medication changes reported     No    Fall or balance concerns reported    No    Warm-up and Cool-down Performed on first and last piece of equipment    Resistance Training Performed Yes    VAD Patient? No    PAD/SET Patient? No      Pain Assessment   Currently in Pain? No/denies                Social History   Tobacco Use  Smoking Status Former   Pack years: 0.00   Types: Cigarettes   Quit date: 2000   Years since quitting: 22.5  Smokeless Tobacco Never    Goals Met:  Independence with exercise equipment Exercise tolerated well No report of cardiac concerns or symptoms Strength training completed today  Goals Unmet:  Not Applicable  Comments: Pt able to follow exercise prescription today without complaint.  Will continue to monitor for progression.    Dr. Emily Filbert is Medical Director for [place].  Dr. Ottie Glazier is Medical Director for [facility] Pulmonary Rehabilitation.

## 2021-03-16 ENCOUNTER — Other Ambulatory Visit: Payer: Self-pay

## 2021-03-16 ENCOUNTER — Encounter: Payer: Medicare Other | Admitting: *Deleted

## 2021-03-16 DIAGNOSIS — J449 Chronic obstructive pulmonary disease, unspecified: Secondary | ICD-10-CM

## 2021-03-16 NOTE — Progress Notes (Signed)
Daily Session Note  Patient Details  Name: Anna Mooney MRN: [id number] Date of Birth: 09-09-1946 Referring Provider:   Flowsheet Row Pulmonary Rehab from 03/13/2021 in [facility] Cardiac and Pulmonary Rehab  Referring Provider Mosher       Encounter Date: 03/16/2021  Check In:  Session Check In - 03/16/21 1339       Check-In   Supervising physician immediately available to respond to emergencies See telemetry face sheet for immediately available ER MD    Location [facility]-Cardiac & Pulmonary Rehab    Staff Present Renita Papa, RN BSN;Joseph Lacey, RCP,RRT,BSRT;Jessica [place], [place], RCEP, CCRP, CCET    Virtual Visit No    Medication changes reported     No    Fall or balance concerns reported    No    Warm-up and Cool-down Performed on first and last piece of equipment    Resistance Training Performed Yes    VAD Patient? No    PAD/SET Patient? No      Pain Assessment   Currently in Pain? No/denies                Social History   Tobacco Use  Smoking Status Former   Pack years: 0.00   Types: Cigarettes   Quit date: 2000   Years since quitting: 22.5  Smokeless Tobacco Never    Goals Met:  Independence with exercise equipment Exercise tolerated well No report of cardiac concerns or symptoms Strength training completed today  Goals Unmet:  Not Applicable  Comments: Pt able to follow exercise prescription today without complaint.  Will continue to monitor for progression.    Dr. Emily Filbert is Medical Director for [place].  Dr. Ottie Glazier is Medical Director for [facility] Pulmonary Rehabilitation.

## 2021-03-22 ENCOUNTER — Encounter: Payer: Medicare Other | Attending: Internal Medicine

## 2021-03-22 ENCOUNTER — Other Ambulatory Visit: Payer: Self-pay

## 2021-03-22 DIAGNOSIS — J9621 Acute and chronic respiratory failure with hypoxia: Secondary | ICD-10-CM | POA: Insufficient documentation

## 2021-03-22 DIAGNOSIS — J449 Chronic obstructive pulmonary disease, unspecified: Secondary | ICD-10-CM | POA: Insufficient documentation

## 2021-03-22 NOTE — Progress Notes (Signed)
Daily Session Note  Patient Details  Name: Anna Mooney MRN: [id number] Date of Birth: [birth year] Referring Provider:   Flowsheet Row Pulmonary Rehab from 03/13/2021 in [facility] Cardiac and Pulmonary Rehab  Referring Provider Mosher       Encounter Date: 03/22/2021  Check In:  Session Check In - 03/22/21 1332       Check-In   Supervising physician immediately available to respond to emergencies See telemetry face sheet for immediately available ER MD    Location [facility]-Cardiac & Pulmonary Rehab    Staff Present Birdie Sons, MPA, Nino Glow, MS, ASCM CEP, Exercise Physiologist;Joseph Tessie Fass, [place]    Virtual Visit No    Medication changes reported     No    Fall or balance concerns reported    No    Warm-up and Cool-down Performed on first and last piece of equipment    Resistance Training Performed Yes    VAD Patient? No    PAD/SET Patient? No      Pain Assessment   Currently in Pain? No/denies                Social History   Tobacco Use  Smoking Status Former   Pack years: 0.00   Types: Cigarettes   Quit date: 2000   Years since quitting: 22.5  Smokeless Tobacco Never    Goals Met:  Independence with exercise equipment Exercise tolerated well No report of cardiac concerns or symptoms Strength training completed today  Goals Unmet:  Not Applicable  Comments: Pt able to follow exercise prescription today without complaint.  Will continue to monitor for progression.    Dr. Emily Filbert is Medical Director for [place].  Dr. Ottie Glazier is Medical Director for [facility] Pulmonary Rehabilitation.

## 2021-03-23 ENCOUNTER — Other Ambulatory Visit: Payer: Self-pay

## 2021-03-23 ENCOUNTER — Encounter: Payer: Medicare Other | Admitting: *Deleted

## 2021-03-23 DIAGNOSIS — J449 Chronic obstructive pulmonary disease, unspecified: Secondary | ICD-10-CM | POA: Diagnosis not present

## 2021-03-23 NOTE — Progress Notes (Signed)
Daily Session Note  Patient Details  Name: Anna Mooney MRN: [id number] Date of Birth: August 01, 1946 Referring Provider:   Flowsheet Row Pulmonary Rehab from 03/13/2021 in [facility] Cardiac and Pulmonary Rehab  Referring Provider Mosher       Encounter Date: 03/23/2021  Check In:  Session Check In - 03/23/21 1330       Check-In   Supervising physician immediately available to respond to emergencies See telemetry face sheet for immediately available ER MD    Location [facility]-Cardiac & Pulmonary Rehab    Staff Present Renita Papa, RN BSN;Melissa [place], RDN, LDN;Jessica [place], MA, RCEP, CCRP, Marylynn Pearson, MS, ASCM CEP, Exercise Physiologist    Virtual Visit No    Medication changes reported     No    Fall or balance concerns reported    No    Warm-up and Cool-down Performed on first and last piece of equipment    Resistance Training Performed Yes    VAD Patient? No    PAD/SET Patient? No      Pain Assessment   Currently in Pain? No/denies                Social History   Tobacco Use  Smoking Status Former   Pack years: 0.00   Types: Cigarettes   Quit date: 2000   Years since quitting: 22.5  Smokeless Tobacco Never    Goals Met:  Independence with exercise equipment Exercise tolerated well No report of cardiac concerns or symptoms Strength training completed today  Goals Unmet:  Not Applicable  Comments: Pt able to follow exercise prescription today without complaint.  Will continue to monitor for progression.    Dr. Emily Filbert is Medical Director for [place].  Dr. Ottie Glazier is Medical Director for [facility] Pulmonary Rehabilitation.

## 2021-03-27 ENCOUNTER — Encounter: Payer: Medicare Other | Admitting: *Deleted

## 2021-03-27 ENCOUNTER — Other Ambulatory Visit: Payer: Self-pay

## 2021-03-27 DIAGNOSIS — J449 Chronic obstructive pulmonary disease, unspecified: Secondary | ICD-10-CM

## 2021-03-27 NOTE — Progress Notes (Signed)
Daily Session Note  Patient Details  Name: Anna Mooney MRN: [id number] Date of Birth: 07/16/1946 Referring Provider:   Flowsheet Row Pulmonary Rehab from 03/13/2021 in [facility] Cardiac and Pulmonary Rehab  Referring Provider Mosher       Encounter Date: 03/27/2021  Check In:  Session Check In - 03/27/21 1407       Check-In   Supervising physician immediately available to respond to emergencies See telemetry face sheet for immediately available ER MD    Location [facility]-Cardiac & Pulmonary Rehab    Staff Present Birdie Sons, MPA, Mauricia Area, BS, ACSM CEP, Exercise Physiologist;Ariadne Rissmiller, RN, BSN, CCRP    Virtual Visit No    Medication changes reported     No    Fall or balance concerns reported    No    Warm-up and Cool-down Performed on first and last piece of equipment    Resistance Training Performed Yes    VAD Patient? No    PAD/SET Patient? No      Pain Assessment   Currently in Pain? No/denies                Social History   Tobacco Use  Smoking Status Former   Pack years: 0.00   Types: Cigarettes   Quit date: 2000   Years since quitting: 22.5  Smokeless Tobacco Never    Goals Met:  Proper associated with RPD/PD & O2 Sat Independence with exercise equipment Exercise tolerated well No report of cardiac concerns or symptoms  Goals Unmet:  Not Applicable  Comments: Pt able to follow exercise prescription today without complaint.  Will continue to monitor for progression.    Dr. Emily Filbert is Medical Director for [place].  Dr. Ottie Glazier is Medical Director for [facility] Pulmonary Rehabilitation.

## 2021-03-29 ENCOUNTER — Other Ambulatory Visit: Payer: Self-pay

## 2021-03-29 ENCOUNTER — Encounter: Payer: Medicare Other | Admitting: *Deleted

## 2021-03-29 ENCOUNTER — Encounter: Payer: Self-pay | Admitting: *Deleted

## 2021-03-29 DIAGNOSIS — J449 Chronic obstructive pulmonary disease, unspecified: Secondary | ICD-10-CM

## 2021-03-29 NOTE — Progress Notes (Signed)
Daily Session Note  Patient Details  Name: Wilmoth Rasnic MRN: [id number] Date of Birth: 1946-06-17 Referring Provider:   Flowsheet Row Pulmonary Rehab from 03/13/2021 in [facility] Cardiac and Pulmonary Rehab  Referring Provider Mos[place]       Encounter Date: 03/29/2021  Check In:  Session Check In - 03/29/21 1347       Check-In   Supervising physician immediately available to respond to emergencies See telemetry face sheet for immediately available ER MD    Location [facility]-Cardiac & Pulmonary Rehab    Staff Present Heath Lark, RN, BSN, Jacklynn Bue, MS, ASCM CEP, Exercise Physiologist;Kelly Rosalia Hammers, MPA, RN    Virtual Visit No    Medication changes reported     No    Fall or balance concerns reported    No    Warm-up and Cool-down Performed on first and last piece of equipment    Resistance Training Performed Yes    VAD Patient? No    PAD/SET Patient? No      Pain Assessment   Currently in Pain? No/denies                Social History   Tobacco Use  Smoking Status Former   Pack years: 0.00   Types: Cigarettes   Quit date: 2000   Years since quitting: 22.5  Smokeless Tobacco Never    Goals Met:  Proper associated with RPD/PD & O2 Sat Independence with exercise equipment Exercise tolerated well No report of cardiac concerns or symptoms  Goals Unmet:  Not Applicable  Comments: Pt able to follow exercise prescription today without complaint.  Will continue to monitor for progression.    Dr. Emily Filbert is Medical Director for [place].  Dr. Ottie Glazier is Medical Director for [facility] Pulmonary Rehabilitation.

## 2021-03-29 NOTE — Progress Notes (Signed)
Pulmonary Individual Treatment Plan  Patient Details  Name: Lamanda Rudder MRN: [id number] Date of Birth: 04/29/1946 Referring Provider:   Flowsheet Row Pulmonary Rehab from 03/13/2021 in [facility]  Referring Provider Mosher       Initial Encounter Date:  Flowsheet Row Pulmonary Rehab from 03/13/2021 in [facility] Re[facility]  Date 03/13/21       Visit Diagnosis: Chronic obstructive pulmonary disease, unspecified COPD type (HCC)  Patient's Home Medications on Admission:  Current Outpatient Medications:    albuterol (VENTOLIN HFA) 108 (90 Base) MCG/ACT inhaler, Inhale into the lungs., Disp: , Rfl:    Calcium Carbonate-Vitamin D 600-200 MG-UNIT TABS, Take 1 tablet by mouth daily., Disp: , Rfl:    ibuprofen (ADVIL) 200 MG tablet, Take by mouth., Disp: , Rfl:    ipratropium-albuterol (DUONEB) 0.5-2.5 (3) MG/3ML SOLN, Inhale into the lungs., Disp: , Rfl:    Melatonin 10 MG TABS, Take by mouth., Disp: , Rfl:    montelukast (SINGULAIR) 10 MG tablet, SMARTSIG:1 Tablet(s) By Mouth Every Evening, Disp: , Rfl:    TRELEGY ELLIPTA 100-62.5-25 MCG/INH AEPB, Inhale 1 puff into the lungs daily., Disp: , Rfl:   Past Medical History: No past medical history on file.  Tobacco Use: Social History   Tobacco Use  Smoking Status Former   Pack years: 0.00   Types: Cigarettes   Quit date: 2000   Years since quitting: 22.5  Smokeless Tobacco Never    Labs: Recent Review Flowsheet Data   There is no flowsheet data to display.      Pulmonary Assessment Scores:  Pulmonary Assessment Scores     Row Name 03/09/21 1513 03/13/21 1403       ADL UCSD   SOB Score total 80 --    Rest 1 --    Walk 4 --    Stairs 5 --    Bath 4 --    Dress 3 --    Shop 4 --         CAT Score      CAT Score -- 18         mMRC Score      mMRC Score -- 3            UCSD: Self-administered rating of dyspnea associated with activities of daily living (ADLs) 6-point  scale (0 = "not at all" to 5 = "maximal or unable to do because of breathlessness")  Scoring Scores range from 0 to 120.  Minimally important difference is 5 units  CAT: CAT can identify the health impairment of COPD patients and is better correlated with disease progression.  CAT has a scoring range of zero to 40. The CAT score is classified into four groups of low (less than 10), medium (10 - 20), high (21-30) and very high (31-40) based on the impact level of disease on health status. A CAT score over 10 suggests significant symptoms.  A worsening CAT score could be explained by an exacerbation, poor medication adherence, poor inhaler technique, or progression of COPD or comorbid conditions.  CAT MCID is 2 points  mMRC: mMRC (Modified Medical Research Council) Dyspnea Scale is used to assess the degree of baseline functional disability in patients of respiratory disease due to dyspnea. No minimal important difference is established. A decrease in score of 1 point or greater is considered a positive change.   Pulmonary Function Assessment:   Exercise Target Goals: Exercise Program Goal: Individual exercise prescription set using results  from initial 6 min walk test and THRR while considering  patient's activity barriers and safety.   Exercise Prescription Goal: Initial exercise prescription builds to 30-45 minutes a day of aerobic activity, 2-3 days per week.  Home exercise guidelines will be given to patient during program as part of exercise prescription that the participant will acknowledge.  Education: Aerobic Exercise: - Group verbal and visual presentation on the components of exercise prescription. Introduces F.I.T.T principle from ACSM for exercise prescriptions.  Reviews F.I.T.T. principles of aerobic exercise including progression. Written material given at graduation.   Education: Resistance Exercise: - Group verbal and visual presentation on the components of exercise  prescription. Introduces F.I.T.T principle from ACSM for exercise prescriptions  Reviews F.I.T.T. principles of resistance exercise including progression. Written material given at graduation. Flowsheet Row Pulmonary Rehab from 03/22/2021 in [facility] Pulmonary Rehab  Date 03/15/21  Educator [facility]  Instruction Review Code 1- Verbalizes Understanding        Education: Exercise & Equipment Safety: - Individual verbal instruction and demonstration of equipment use and safety with use of the equipment. Flowsheet Row Pulmonary Rehab from 03/22/2021 in [facility] Card[facility] and Pulmonary Rehab  Date 03/09/21  Educator [facility]  Instruction Review Code 1- Verbalizes Understanding       Education: Exercise Physiology & General Exercise Guidelines: - Group verbal and written instruction with models to review the exercise physiology of the cardiovascular system and associated critical values. Provides general exercise guidelines with specific guidelines to those with heart or lung disease.    Education: Flexibility, Balance, Mind/Body Relaxation: - Group verbal and visual presentation with interactive activity on the components of exercise prescription. Introduces F.I.T.T principle from ACSM for exercise prescriptions. Reviews F.I.T.T. principles of flexibility and balance exercise training including progression. Also discusses the mind body connection.  Reviews various relaxation techniques to help reduce and manage stress (i.e. Deep breathing, progressive muscle relaxation, and visualization). Balance handout provided to take home. Written material given at graduation. Flowsheet Row Pulmonary Rehab from 03/22/2021 in [facility] Card[facility] and Pulmonary Rehab  Date 03/22/21  Educator [facility]  Instruction Review Code 1- Verbalizes Understanding       Activity Barriers & Risk Stratification:  Activity Barriers & Cardiac Risk Stratification - 03/03/21 1307       Activity Barriers & Cardiac Risk Stratification   Activity  Barriers None             6 Minute Walk:  6 Minute Walk     Row Name 03/13/21 1403         6 Minute Walk   Phase Initial     Distance 500 feet     Walk Time 4.5 minutes     # of Rest Breaks 2     MPH 1.3     METS 2     RPE 17     Perceived Dyspnea  3     VO2 Peak 7.3     Symptoms Yes (comment)     Comments SOB     Resting HR 121 bpm     Resting BP 140/78     Resting Oxygen Saturation  96 %     Exercise Oxygen Saturation  during 6 min walk 92 %     Max Ex. HR 132 bpm     Max Ex. BP 154/84     2 Minute Post BP 118/80           Interval HR     1 Minute HR  121     2 Minute HR 129     3 Minute HR 132     4 Minute HR 127     5 Minute HR 124     6 Minute HR 126     2 Minute Post HR 121     Interval Heart Rate? Yes           Interval Oxygen     Interval Oxygen? Yes     Baseline Oxygen Saturation % 96 %     1 Minute Oxygen Saturation % 95 %     1 Minute Liters of Oxygen 2 L     2 Minute Oxygen Saturation % 93 %     2 Minute Liters of Oxygen 2 L     3 Minute Oxygen Saturation % 92 %     3 Minute Liters of Oxygen 2 L     4 Minute Oxygen Saturation % 94 %     4 Minute Liters of Oxygen 2 L     5 Minute Oxygen Saturation % 93 %     5 Minute Liters of Oxygen 2 L     6 Minute Oxygen Saturation % 92 %     6 Minute Liters of Oxygen 2 L     2 Minute Post Oxygen Saturation % 96 %     2 Minute Post Liters of Oxygen 2 L            Oxygen Initial Assessment:  Oxygen Initial Assessment - 03/03/21 1305       Home Oxygen   Home Oxygen Device Home Concentrator;Portable Concentrator    Sleep Oxygen Prescription Continuous    Liters per minute 2    Home Exercise Oxygen Prescription Continuous    Liters per minute 2    Home Resting Oxygen Prescription Continuous    Liters per minute 2    Compliance with Home Oxygen Use Yes      Intervention   Short Term Goals To learn and exhibit compliance with exercise, home and travel O2 prescription;To learn and understand  importance of monitoring SPO2 with pulse oximeter and demonstrate accurate use of the pulse oximeter.;To learn and understand importance of maintaining oxygen saturations>88%;To learn and demonstrate proper pursed lip breathing techniques or other breathing techniques. ;To learn and demonstrate proper use of respiratory medications    Long  Term Goals Exhibits compliance with exercise, home  and travel O2 prescription;Verbalizes importance of monitoring SPO2 with pulse oximeter and return demonstration;Maintenance of O2 saturations>88%;Exhibits proper breathing techniques, such as pursed lip breathing or other method taught during program session;Compliance with respiratory medication;Demonstrates proper use of MDI's             Oxygen Re-Evaluation:  Oxygen Re-Evaluation     Row Name 03/13/21 1419             Home Oxygen   Home Oxygen Device Home Concentrator;Portable Concentrator       Sleep Oxygen Prescription Continuous       Liters per minute 2       Home Exercise Oxygen Prescription Continuous       Liters per minute 2       Home Resting Oxygen Prescription Continuous       Liters per minute 2       Compliance with Home Oxygen Use Yes               Goals/Expected Outcomes     Short Term Goals  To learn and exhibit compliance with exercise, home and travel O2 prescription;To learn and understand importance of monitoring SPO2 with pulse oximeter and demonstrate accurate use of the pulse oximeter.;To learn and understand importance of maintaining oxygen saturations>88%;To learn and demonstrate proper pursed lip breathing techniques or other breathing techniques.        Long  Term Goals Exhibits compliance with exercise, home  and travel O2 prescription;Verbalizes importance of monitoring SPO2 with pulse oximeter and return demonstration;Maintenance of O2 saturations>88%;Exhibits proper breathing techniques, such as pursed lip breathing or other method taught during program session        Comments Reviewed PLB technique with pt.  Talked about how it works and it's importance in maintaining their exercise saturations.       Goals/Expected Outcomes Short: Become more profiecient at using PLB.   Long: Become independent at using PLB.               Oxygen Discharge (Final Oxygen Re-Evaluation):  Oxygen Re-Evaluation - 03/13/21 1419       Home Oxygen   Home Oxygen Device Home Concentrator;Portable Concentrator    Sleep Oxygen Prescription Continuous    Liters per minute 2    Home Exercise Oxygen Prescription Continuous    Liters per minute 2    Home Resting Oxygen Prescription Continuous    Liters per minute 2    Compliance with Home Oxygen Use Yes      Goals/Expected Outcomes   Short Term Goals To learn and exhibit compliance with exercise, home and travel O2 prescription;To learn and understand importance of monitoring SPO2 with pulse oximeter and demonstrate accurate use of the pulse oximeter.;To learn and understand importance of maintaining oxygen saturations>88%;To learn and demonstrate proper pursed lip breathing techniques or other breathing techniques.     Long  Term Goals Exhibits compliance with exercise, home  and travel O2 prescription;Verbalizes importance of monitoring SPO2 with pulse oximeter and return demonstration;Maintenance of O2 saturations>88%;Exhibits proper breathing techniques, such as pursed lip breathing or other method taught during program session    Comments Reviewed PLB technique with pt.  Talked about how it works and it's importance in maintaining their exercise saturations.    Goals/Expected Outcomes Short: Become more profiecient at using PLB.   Long: Become independent at using PLB.             Initial Exercise Prescription:  Initial Exercise Prescription - 03/13/21 1400       Date of Initial Exercise RX and Referring Provider   Date 03/13/21    Referring Provider Mosher      Oxygen   Oxygen Continuous    Liters 2       Treadmill   MPH 1.3    Grade 0    Minutes 15   rest as needed   METs 2      Recumbant Bike   Level 1    RPM 60    Minutes 15    METs 2      NuStep   Level 1    SPM 80    Minutes 15    METs 2      T5 Nustep   Level 1    SPM 80    Minutes 15    METs 2      Biostep-RELP   Level 1    SPM 50    Minutes 15    METs 2      Prescription Details   Frequency (times per week)  3    Duration Progress to 30 minutes of continuous aerobic without signs/symptoms of physical distress      Intensity   THRR 40-80% of Max Heartrate 131-141    Ratings of Perceived Exertion 11-13    Perceived Dyspnea 0-4      Resistance Training   Training Prescription Yes    Weight 3 lb    Reps 10-15             Perform Capillary Blood Glucose checks as needed.  Exercise Prescription Changes:   Exercise Prescription Changes     Row Name 03/13/21 1400 03/27/21 1000           Response to Exercise   Blood Pressure (Admit) 140/78 150/84      Blood Pressure (Exercise) 154/84 142/86      Blood Pressure (Exit) 118/80 100/60      Heart Rate (Admit) 121 bpm 102 bpm      Heart Rate (Exercise) 132 bpm 109 bpm      Heart Rate (Exit) 121 bpm 99 bpm      Oxygen Saturation (Admit) 96 % 95 %      Oxygen Saturation (Exercise) 92 % 93 %      Oxygen Saturation (Exit) 96 % 99 %      Rating of Perceived Exertion (Exercise) 17 13      Perceived Dyspnea (Exercise) 3 2      Symptoms SOB SOB      Comments -- first full day of exercise      Duration -- Progress to 30 minutes of  aerobic without signs/symptoms of physical distress      Intensity -- THRR unchanged             Progression      Progression -- Continue to progress workloads to maintain intensity without signs/symptoms of physical distress.      Average METs -- 1.77             Resistance Training      Training Prescription -- Yes      Weight -- 3 lb      Reps -- 10-15             Interval Training      Interval Training -- No              Oxygen      Oxygen -- Continuous      Liters -- 2             Treadmill      MPH -- 1.3      Grade -- 0      Minutes -- 15      METs -- 2             NuStep      Level -- 3      Minutes -- 15      METs -- 1.8              Exercise Comments:   Exercise Comments     Row Name 03/13/21 1418           Exercise Comments First full day of exercise!  Patient was oriented to gym and equipment including functions, settings, policies, and procedures.  Patient's individual exercise prescription and treatment plan were reviewed.  All starting workloads were established based on the results of the 6 minute walk test done at initial orientation visit.  The plan for exercise progression was  also introduced and progression will be customized based on patient's performance and goals.                Exercise Goals and Review:   Exercise Goals     Row Name 03/13/21 1409             Exercise Goals   Increase Physical Activity Yes       Intervention Provide advice, education, support and counseling about physical activity/exercise needs.;Develop an individualized exercise prescription for aerobic and resistive training based on initial evaluation findings, risk stratification, comorbidities and participant's personal goals.       Expected Outcomes Short Term: Attend rehab on a regular basis to increase amount of physical activity.;Long Term: Add in home exercise to make exercise part of routine and to increase amount of physical activity.;Long Term: Exercising regularly at least 3-5 days a week.       Increase Strength and Stamina Yes       Intervention Provide advice, education, support and counseling about physical activity/exercise needs.;Develop an individualized exercise prescription for aerobic and resistive training based on initial evaluation findings, risk stratification, comorbidities and participant's personal goals.       Expected Outcomes Short Term: Increase  workloads from initial exercise prescription for resistance, speed, and METs.;Short Term: Perform resistance training exercises routinely during rehab and add in resistance training at home;Long Term: Improve cardiorespiratory fitness, muscular endurance and strength as measured by increased METs and functional capacity (6MWT)       Able to understand and use rate of perceived exertion (RPE) scale Yes       Intervention Provide education and explanation on how to use RPE scale       Expected Outcomes Short Term: Able to use RPE daily in rehab to express subjective intensity level;Long Term:  Able to use RPE to guide intensity level when exercising independently       Able to understand and use Dyspnea scale Yes       Intervention Provide education and explanation on how to use Dyspnea scale       Expected Outcomes Short Term: Able to use Dyspnea scale daily in rehab to express subjective sense of shortness of breath during exertion;Long Term: Able to use Dyspnea scale to guide intensity level when exercising independently       Knowledge and understanding of Target Heart Rate Range (THRR) Yes       Intervention Provide education and explanation of THRR including how the numbers were predicted and where they are located for reference       Expected Outcomes Short Term: Able to state/look up THRR;Long Term: Able to use THRR to govern intensity when exercising independently;Short Term: Able to use daily as guideline for intensity in rehab       Able to check pulse independently Yes       Intervention Provide education and demonstration on how to check pulse in carotid and radial arteries.;Review the importance of being able to check your own pulse for safety during independent exercise       Expected Outcomes Short Term: Able to explain why pulse checking is important during independent exercise       Understanding of Exercise Prescription Yes       Intervention Provide education, explanation, and written  materials on patient's individual exercise prescription       Expected Outcomes Short Term: Able to explain program exercise prescription;Long Term: Able to explain home exercise prescription to exercise independently  Improve claudication pain toleration; Improve walking ability Yes       Intervention Participate in PAD/SET Rehab 2-3 days a week, walking at home as part of exercise prescription;Attend education sessions to aid in risk factor modification and understanding of disease process       Expected Outcomes Short Term: Improve walking distance/time to onset of claudication pain;Long Term: Improve score of PAD questionnaires                Exercise Goals Re-Evaluation :  Exercise Goals Re-Evaluation     Row Name 03/13/21 1418 03/27/21 1012           Exercise Goal Re-Evaluation   Exercise Goals Review Increase Physical Activity;Knowledge and understanding of Target Heart Rate Range (THRR);Able to understand and use rate of perceived exertion (RPE) scale;Understanding of Exercise Prescription;Increase Strength and Stamina;Able to understand and use Dyspnea scale;Able to check pulse independently Increase Physical Activity;Increase Strength and Stamina      Comments Reviewed RPE and dyspnea scales, THR and program prescription with pt today.  Pt voiced understanding and was given a copy of goals to take home. Lyne is doing well her first couple of weeks of being at rehab. She has already increased to level 3 on the T4 NuStep. She is building up tolerance on the treadmill and oxygen saturations are staying above 88% during exercise. Will continue to monitor.      Expected Outcomes Short: Use RPE daily to regulate intensity. Long: Follow program prescription in THR. Short: Continue to increase speed on treadmill Long: Continue to increase overall strength and stamina               Discharge Exercise Prescription (Final Exercise Prescription Changes):  Exercise Prescription  Changes - 03/27/21 1000       Response to Exercise   Blood Pressure (Admit) 150/84    Blood Pressure (Exercise) 142/86    Blood Pressure (Exit) 100/60    Heart Rate (Admit) 102 bpm    Heart Rate (Exercise) 109 bpm    Heart Rate (Exit) 99 bpm    Oxygen Saturation (Admit) 95 %    Oxygen Saturation (Exercise) 93 %    Oxygen Saturation (Exit) 99 %    Rating of Perceived Exertion (Exercise) 13    Perceived Dyspnea (Exercise) 2    Symptoms SOB    Comments first full day of exercise    Duration Progress to 30 minutes of  aerobic without signs/symptoms of physical distress    Intensity THRR unchanged      Progression   Progression Continue to progress workloads to maintain intensity without signs/symptoms of physical distress.    Average METs 1.77      Resistance Training   Training Prescription Yes    Weight 3 lb    Reps 10-15      Interval Training   Interval Training No      Oxygen   Oxygen Continuous    Liters 2      Treadmill   MPH 1.3    Grade 0    Minutes 15    METs 2      NuStep   Level 3    Minutes 15    METs 1.8             Nutrition:  Target Goals: Understanding of nutrition guidelines, daily intake of sodium 1500mg , cholesterol 200mg , calories 30% from fat and 7% or less from saturated fats, daily to have 5 or more servings of  fruits and vegetables.  Education: All About Nutrition: -Group instruction provided by verbal, written material, interactive activities, discussions, models, and posters to present general guidelines for heart healthy nutrition including fat, fiber, MyPlate, the role of sodium in heart healthy nutrition, utilization of the nutrition label, and utilization of this knowledge for meal planning. Follow up email sent as well. Written material given at graduation.   Biometrics:  Pre Biometrics - 03/13/21 1410       Pre Biometrics   Height 5\' 6"  (1.676 m)    Weight 139 lb 14.4 oz (63.5 kg)    BMI (Calculated) 22.59    Single  Leg Stand 6.34 seconds              Nutrition Therapy Plan and Nutrition Goals:   Nutrition Assessments:  MEDIFICTS Score Key: ?70 Need to make dietary changes  40-70 Heart Healthy Diet ? 40 Therapeutic Level Cholesterol Diet  Flowsheet Row Pulmonary Rehab from 03/09/2021 in [facility] Cardiac and Pulmonary Rehab  Picture Your Plate Total Score on Admission 45      Picture Your Plate Scores: [facility] Unhealthy dietary pattern with much room for improvement. 41-50 Dietary pattern unlikely to meet recommendations for good health and room for improvement. 51-60 More healthful dietary pattern, with some room for improvement.  >60 Healthy dietary pattern, although there may be some specific behaviors that could be improved.   Nutrition Goals Re-Evaluation:   Nutrition Goals Discharge (Final Nutrition Goals Re-Evaluation):   Psychosocial: Target Goals: Acknowledge presence or absence of significant depression and/or stress, maximize coping skills, provide positive support system. Participant is able to verbalize types and ability to use techniques and skills needed for reducing stress and depression.   Education: Stress, Anxiety, and Depression - Group verbal and visual presentation to define topics covered.  Reviews how body is impacted by stress, anxiety, and depression.  Also discusses healthy ways to reduce stress and to treat/manage anxiety and depression.  Written material given at graduation.   Education: Sleep Hygiene -Provides group verbal and written instruction about how sleep can affect your health.  Define sleep hygiene, discuss sleep cycles and impact of sleep habits. Review good sleep hygiene tips.    Initial Review & Psychosocial Screening:  Initial Psych Review & Screening - 03/03/21 1312       Initial Review   Current issues with Current Stress Concerns;Current Sleep Concerns    Source of Stress Concerns Unable to participate in former interests or hobbies;Unable  to perform yard/household activities      Family Dynamics   Good Support System? Yes   nieces, nephews, brother     Barriers   Psychosocial barriers to participate in program There are no identifiable barriers or psychosocial needs.      Screening Interventions   Interventions Encouraged to exercise;Provide feedback about the scores to participant;To provide support and resources with identified psychosocial needs    Expected Outcomes Short Term goal: Utilizing psychosocial counselor, staff and physician to assist with identification of specific Stressors or current issues interfering with healing process. Setting desired goal for each stressor or current issue identified.;Long Term Goal: Stressors or current issues are controlled or eliminated.;Short Term goal: Identification and review with participant of any Quality of Life or Depression concerns found by scoring the questionnaire.;Long Term goal: The participant improves quality of Life and PHQ9 Scores as seen by post scores and/or verbalization of changes             Quality of Life Scores:  Scores of 19 and below usually indicate a poorer quality of life in these areas.  A difference of  2-3 points is a clinically meaningful difference.  A difference of 2-3 points in the total score of the Quality of Life Index has been associated with significant improvement in overall quality of life, self-image, physical symptoms, and general health in studies assessing change in quality of life.  PHQ-9: Recent Review Flowsheet Data     Depression screen [facility] 2/9 03/09/2021   Decreased Interest 1   Down, Depressed, Hopeless 0   PHQ - 2 Score 1   Altered sleeping 3   Tired, decreased energy 3   Change in appetite 2   Feeling bad or failure about yourself  0   Trouble concentrating 0   Moving slowly or fidgety/restless 0   Suicidal thoughts 0   PHQ-9 Score 9      Interpretation of Total Score  Total Score Depression Severity:  1-4 =  Minimal depression, 5-9 = Mild depression, 10-14 = Moderate depression, 15-19 = Moderately severe depression, 20-27 = Severe depression   Psychosocial Evaluation and Intervention:  Psychosocial Evaluation - 03/03/21 1319       Psychosocial Evaluation & Interventions   Interventions Encouraged to exercise with the program and follow exercise prescription    Comments Ms. Knupp is coming to pulmonary rehab for COPD in hopes this will help her breathing. She lives alone, with nieces, nephews and a brother close by, and wishes to maintain her independence as long as possible. She started wearing oxygen in March and is compliant with it. Her appetite is a struggle in that when she finally is hungry and makes food she only eats a little of it. She states she feels like her stress has gone down now that one of her brothers is no longer suffering with dementia- which was very difficult to watch for her. She does wish she could do more than just sit at home and is hopeful this program will change that.    Expected Outcomes Short: attend pulmonary rehab for education and exercise. Long: develop positive health care habits.             Psychosocial Re-Evaluation:   Psychosocial Discharge (Final Psychosocial Re-Evaluation):   Education: Education Goals: Education classes will be provided on a weekly basis, covering required topics. Participant will state understanding/return demonstration of topics presented.  Learning Barriers/Preferences:  Learning Barriers/Preferences - 03/03/21 1314       Learning Barriers/Preferences   Learning Barriers None    Learning Preferences None             General Pulmonary Education Topics:  Infection Prevention: - Provides verbal and written material to individual with discussion of infection control including proper hand washing and proper equipment cleaning during exercise session. Flowsheet Row Pulmonary Rehab from 03/22/2021 in [facility] Cardiac and  Pulmonary Rehab  Date 03/09/21  Educator AS  Instruction Review Code 1- Verbalizes Understanding       Falls Prevention: - Provides verbal and written material to individual with discussion of falls prevention and safety.   Chronic Lung Disease Review: - Group verbal instruction with posters, models, PowerPoint presentations and videos,  to review new updates, new respiratory medications, new advancements in procedures and treatments. Providing information on websites and "800" numbers for continued self-education. Includes information about supplement oxygen, available portable oxygen systems, continuous and intermittent flow rates, oxygen safety, concentrators, and Medicare reimbursement for oxygen. Explanation of Pulmonary Drugs, including class,  frequency, complications, importance of spacers, rinsing mouth after steroid MDI's, and proper cleaning methods for nebulizers. Review of basic lung anatomy and physiology related to function, structure, and complications of lung disease. Review of risk factors. Discussion about methods for diagnosing sleep apnea and types of masks and machines for OSA. Includes a review of the use of types of environmental controls: home humidity, furnaces, filters, dust mite/pet prevention, HEPA vacuums. Discussion about weather changes, air quality and the benefits of nasal washing. Instruction on Warning signs, infection symptoms, calling MD promptly, preventive modes, and value of vaccinations. Review of effective airway clearance, coughing and/or vibration techniques. Emphasizing that all should Create an Action Plan. Written material given at graduation.   AED/CPR: - Group verbal and written instruction with the use of models to demonstrate the basic use of the AED with the basic ABC's of resuscitation.    Anatomy and Cardiac Procedures: - Group verbal and visual presentation and models provide information about basic cardiac anatomy and function. Reviews the  testing methods done to diagnose heart disease and the outcomes of the test results. Describes the treatment choices: Medical Management, Angioplasty, or Coronary Bypass Surgery for treating various heart conditions including Myocardial Infarction, Angina, Valve Disease, and Cardiac Arrhythmias.  Written material given at graduation. Flowsheet Row Pulmonary Rehab from 03/22/2021 in [facility] Cardiac and Pulmonary Rehab  Date 03/15/21  Educator [facility]  Instruction Review Code 1- Verbalizes Understanding       Medication Safety: - Group verbal and visual instruction to review commonly prescribed medications for heart and lung disease. Reviews the medication, class of the drug, and side effects. Includes the steps to properly store meds and maintain the prescription regimen.  Written material given at graduation.   Other: -Provides group and verbal instruction on various topics (see comments)   Knowledge Questionnaire Score:    Core Components/Risk Factors/Patient Goals at Admission:  Personal Goals and Risk Factors at Admission - 03/13/21 1405       Core Components/Risk Factors/Patient Goals on Admission   Improve shortness of breath with ADL's Yes    Intervention Provide education, individualized exercise plan and daily activity instruction to help decrease symptoms of SOB with activities of daily living.    Expected Outcomes Short Term: Improve cardiorespiratory fitness to achieve a reduction of symptoms when performing ADLs;Long Term: Be able to perform more ADLs without symptoms or delay the onset of symptoms    Increase knowledge of respiratory medications and ability to use respiratory devices properly  Yes    Intervention Provide education and demonstration as needed of appropriate use of medications, inhalers, and oxygen therapy.    Expected Outcomes Short Term: Achieves understanding of medications use. Understands that oxygen is a medication prescribed by physician. Demonstrates  appropriate use of inhaler and oxygen therapy.;Long Term: Maintain appropriate use of medications, inhalers, and oxygen therapy.             Education:Diabetes - Individual verbal and written instruction to review signs/symptoms of diabetes, desired ranges of glucose level fasting, after meals and with exercise. Acknowledge that pre and post exercise glucose checks will be done for 3 sessions at entry of program.   Know Your Numbers and Heart Failure: - Group verbal and visual instruction to discuss disease risk factors for cardiac and pulmonary disease and treatment options.  Reviews associated critical values for Overweight/Obesity, Hypertension, Cholesterol, and Diabetes.  Discusses basics of heart failure: signs/symptoms and treatments.  Introduces Heart Failure Zone chart for action plan for heart  failure.  Written material given at graduation.   Core Components/Risk Factors/Patient Goals Review:    Core Components/Risk Factors/Patient Goals at Discharge (Final Review):    ITP Comments:  ITP Comments     Row Name 03/03/21 1324 03/13/21 1413 03/13/21 1417 03/29/21 1121     ITP Comments Initial telephone orientation completed. Diagnosis can be found in [facility] 5/16. EP orientation scheduled for Thursday 6/23 at 2pm. Completed and gym orientation. Initial ITP created and sent for review to Dr Vida Rigger, Medical Director. First full day of exercise!  Patient was oriented to gym and equipment including functions, settings, policies, and procedures.  Patient's individual exercise prescription and treatment plan were reviewed.  All starting workloads were established based on the results of the 6 minute walk test done at initial orientation visit.  The plan for exercise progression was also introduced and progression will be customized based on patient's performance and goals. 30 Day review completed. Medical Director ITP review done, changes made as directed, and signed approval by  Medical Director.   New to program             Comments:

## 2021-03-30 ENCOUNTER — Encounter: Payer: Medicare Other | Admitting: *Deleted

## 2021-03-30 ENCOUNTER — Other Ambulatory Visit: Payer: Self-pay

## 2021-03-30 DIAGNOSIS — J449 Chronic obstructive pulmonary disease, unspecified: Secondary | ICD-10-CM | POA: Diagnosis not present

## 2021-03-30 NOTE — Progress Notes (Signed)
Daily Session Note  Patient Details  Name: Ether Goebel MRN: [id number] Date of Birth: 1946/04/17 Referring Provider:   Flowsheet Row Pulmonary Rehab from 03/13/2021 in [facility] Cardiac and Pulmonary Rehab  Referring Provider Mosher       Encounter Date: 03/30/2021  Check In:  Session Check In - 03/30/21 1340       Check-In   Supervising physician immediately available to respond to emergencies See telemetry face sheet for immediately available ER MD    Location [facility]-Cardiac & Pulmonary Rehab    Staff Present Renita Papa, RN BSN;Joseph Tessie Fass, RCP,RRT,BSRT;Melissa [place], [place], LDN    Virtual Visit No    Medication changes reported     No    Fall or balance concerns reported    No    Warm-up and Cool-down Performed on first and last piece of equipment    Resistance Training Performed Yes    VAD Patient? No    PAD/SET Patient? No      Pain Assessment   Currently in Pain? No/denies                Social History   Tobacco Use  Smoking Status Former   Types: Cigarettes   Quit date: 2000   Years since quitting: 22.5  Smokeless Tobacco Never    Goals Met:  Independence with exercise equipment Exercise tolerated well No report of cardiac concerns or symptoms Strength training completed today  Goals Unmet:  Not Applicable  Comments: Pt able to follow exercise prescription today without complaint.  Will continue to monitor for progression.    Dr. Emily Filbert is Medical Director for [place].  Dr. Ottie Glazier is Medical Director for [facility] Pulmonary Rehabilitation.

## 2021-04-03 ENCOUNTER — Other Ambulatory Visit: Payer: Self-pay

## 2021-04-03 DIAGNOSIS — J449 Chronic obstructive pulmonary disease, unspecified: Secondary | ICD-10-CM | POA: Diagnosis not present

## 2021-04-03 NOTE — Progress Notes (Signed)
Daily Session Note  Patient Details  Name: Anna Mooney MRN: [id number] Date of Birth: Feb 07, 1946 Referring Provider:   Flowsheet Row Pulmonary Rehab from 03/13/2021 in [facility] Cardiac and Pulmonary Rehab  Referring Provider Mosher       Encounter Date: 04/03/2021  Check In:  Session Check In - 04/03/21 1403       Check-In   Supervising physician immediately available to respond to emergencies See telemetry face sheet for immediately available ER MD    Location [facility]-Cardiac & Pulmonary Rehab    Staff Present Birdie Sons, MPA, Mauricia Area, BS, ACSM CEP, Exercise Physiologist;Joseph Tessie Fass, [place]    Virtual Visit No    Medication changes reported     No    Fall or balance concerns reported    No    Warm-up and Cool-down Performed on first and last piece of equipment    Resistance Training Performed Yes    VAD Patient? No    PAD/SET Patient? No      Pain Assessment   Currently in Pain? No/denies                Social History   Tobacco Use  Smoking Status Former   Types: Cigarettes   Quit date: 2000   Years since quitting: 22.5  Smokeless Tobacco Never    Goals Met:  Independence with exercise equipment Exercise tolerated well No report of cardiac concerns or symptoms Strength training completed today  Goals Unmet:  Not Applicable  Comments: Pt able to follow exercise prescription today without complaint.  Will continue to monitor for progression.    Dr. Emily Filbert is Medical Director for [place].  Dr. Ottie Glazier is Medical Director for [facility] Pulmonary Rehabilitation.

## 2021-04-05 ENCOUNTER — Other Ambulatory Visit: Payer: Self-pay

## 2021-04-05 DIAGNOSIS — J449 Chronic obstructive pulmonary disease, unspecified: Secondary | ICD-10-CM

## 2021-04-05 NOTE — Progress Notes (Signed)
Daily Session Note  Patient Details  Name: Anna Mooney MRN: [id number] Date of Birth: August 30, 1946 Referring Provider:   Flowsheet Row Pulmonary Rehab from 03/13/2021 in [facility] Cardiac and Pulmonary Rehab  Referring Provider Mosher       Encounter Date: 04/05/2021  Check In:  Session Check In - 04/05/21 1324       Check-In   Supervising physician immediately available to respond to emergencies See telemetry face sheet for immediately available ER MD    Location [facility]-Cardiac & Pulmonary Rehab    Staff Present Birdie Sons, MPA, Nino Glow, MS, ASCM CEP, Exercise Physiologist;Joseph Tessie Fass, [place]    Virtual Visit No    Medication changes reported     No    Fall or balance concerns reported    No    Warm-up and Cool-down Performed on first and last piece of equipment    Resistance Training Performed Yes    VAD Patient? No    PAD/SET Patient? No      Pain Assessment   Currently in Pain? No/denies                Social History   Tobacco Use  Smoking Status Former   Types: Cigarettes   Quit date: 2000   Years since quitting: 22.5  Smokeless Tobacco Never    Goals Met:  Independence with exercise equipment Exercise tolerated well No report of cardiac concerns or symptoms Strength training completed today  Goals Unmet:  Not Applicable  Comments: Pt able to follow exercise prescription today without complaint.  Will continue to monitor for progression.    Dr. Emily Filbert is Medical Director for [place].  Dr. Ottie Glazier is Medical Director for [facility] Pulmonary Rehabilitation.

## 2021-04-06 ENCOUNTER — Other Ambulatory Visit: Payer: Self-pay

## 2021-04-06 ENCOUNTER — Encounter: Payer: Medicare Other | Admitting: *Deleted

## 2021-04-06 DIAGNOSIS — J449 Chronic obstructive pulmonary disease, unspecified: Secondary | ICD-10-CM | POA: Diagnosis not present

## 2021-04-06 NOTE — Progress Notes (Signed)
Daily Session Note  Patient Details  Name: Anna Mooney MRN: [id number] Date of Birth: [birth year] Referring Provider:   Flowsheet Row Pulmonary Rehab from 03/13/2021 in [facility] Cardiac and Pulmonary Rehab  Referring Provider Mosher       Encounter Date: 04/06/2021  Check In:  Session Check In - 04/06/21 1337       Check-In   Supervising physician immediately available to respond to emergencies See telemetry face sheet for immediately available ER MD    Location [facility]-Cardiac & Pulmonary Rehab    Staff Present Renita Papa, RN BSN;Joseph [place], RCP,RRT,BSRT;Jessica [place], [place], RCEP, CCRP, CCET    Virtual Visit No    Medication changes reported     No    Fall or balance concerns reported    No    Warm-up and Cool-down Performed on first and last piece of equipment    Resistance Training Performed Yes    VAD Patient? No    PAD/SET Patient? No      Pain Assessment   Currently in Pain? No/denies                Social History   Tobacco Use  Smoking Status Former   Types: Cigarettes   Quit date: 2000   Years since quitting: 22.5  Smokeless Tobacco Never    Goals Met:  Independence with exercise equipment Exercise tolerated well No report of cardiac concerns or symptoms Strength training completed today  Goals Unmet:  Not Applicable  Comments: Pt able to follow exercise prescription today without complaint.  Will continue to monitor for progression. Reviewed home exercise with pt today.  Pt plans to walk TM and use bike for exercise.  Reviewed THR, pulse, RPE, sign and symptoms, pulse oximetery and when to call 911 or MD.  Also discussed weather considerations and indoor options.  Pt voiced understanding.    Dr. Emily Filbert is Medical Director for [place].  Dr. Ottie Glazier is Medical Director for [facility] Pulmonary Rehabilitation.

## 2021-04-10 ENCOUNTER — Other Ambulatory Visit: Payer: Self-pay

## 2021-04-10 DIAGNOSIS — J449 Chronic obstructive pulmonary disease, unspecified: Secondary | ICD-10-CM | POA: Diagnosis not present

## 2021-04-10 NOTE — Progress Notes (Signed)
Daily Session Note  Patient Details  Name: Anna Mooney MRN: [id number] Date of Birth: June 11, 1946 Referring Provider:   Flowsheet Row Pulmonary Rehab from 03/13/2021 in [facility] Cardiac and Pulmonary Rehab  Referring Provider Mosher       Encounter Date: 04/10/2021  Check In:  Session Check In - 04/10/21 1326       Check-In   Supervising physician immediately available to respond to emergencies See telemetry face sheet for immediately available ER MD    Location [facility]-Cardiac & Pulmonary Rehab    Staff Present Birdie Sons, MPA, Mauricia Area, BS, ACSM CEP, Exercise Physiologist;Joseph Tessie Fass, [place]    Virtual Visit No    Medication changes reported     No    Fall or balance concerns reported    No    Warm-up and Cool-down Performed on first and last piece of equipment    Resistance Training Performed Yes    VAD Patient? No    PAD/SET Patient? No      Pain Assessment   Currently in Pain? No/denies                Social History   Tobacco Use  Smoking Status Former   Types: Cigarettes   Quit date: 2000   Years since quitting: 22.5  Smokeless Tobacco Never    Goals Met:  Independence with exercise equipment Exercise tolerated well No report of cardiac concerns or symptoms Strength training completed today  Goals Unmet:  Not Applicable  Comments: Pt able to follow exercise prescription today without complaint.  Will continue to monitor for progression.    Dr. Emily Filbert is Medical Director for [place].  Dr. Ottie Glazier is Medical Director for [facility] Pulmonary Rehabilitation.

## 2021-04-12 ENCOUNTER — Other Ambulatory Visit: Payer: Self-pay

## 2021-04-12 DIAGNOSIS — J449 Chronic obstructive pulmonary disease, unspecified: Secondary | ICD-10-CM | POA: Diagnosis not present

## 2021-04-12 NOTE — Progress Notes (Signed)
Daily Session Note  Patient Details  Name: Anna Mooney MRN: [id number] Date of Birth: [birth year] Referring Provider:   Flowsheet Row Pulmonary Rehab from 03/13/2021 in [facility] Cardiac and Pulmonary Rehab  Referring Provider Mosher       Encounter Date: 04/12/2021  Check In:  Session Check In - 04/12/21 1324       Check-In   Supervising physician immediately available to respond to emergencies See telemetry face sheet for immediately available ER MD    Location [facility]-Cardiac & Pulmonary Rehab    Staff Present Birdie Sons, MPA, Nino Glow, MS, ASCM CEP, Exercise Physiologist;Joseph Tessie Fass, [place]    Virtual Visit No    Medication changes reported     No    Fall or balance concerns reported    No    Warm-up and Cool-down Performed on first and last piece of equipment    Resistance Training Performed Yes    VAD Patient? No    PAD/SET Patient? No      Pain Assessment   Currently in Pain? No/denies                Social History   Tobacco Use  Smoking Status Former   Types: Cigarettes   Quit date: 2000   Years since quitting: 22.5  Smokeless Tobacco Never    Goals Met:  Independence with exercise equipment Exercise tolerated well No report of cardiac concerns or symptoms Strength training completed today  Goals Unmet:  Not Applicable  Comments: Pt able to follow exercise prescription today without complaint.  Will continue to monitor for progression.    Dr. Emily Filbert is Medical Director for [place].  Dr. Ottie Glazier is Medical Director for [facility] Pulmonary Rehabilitation.

## 2021-04-13 ENCOUNTER — Other Ambulatory Visit: Payer: Self-pay

## 2021-04-13 ENCOUNTER — Encounter: Payer: Medicare Other | Admitting: *Deleted

## 2021-04-13 DIAGNOSIS — J449 Chronic obstructive pulmonary disease, unspecified: Secondary | ICD-10-CM | POA: Diagnosis not present

## 2021-04-13 NOTE — Progress Notes (Signed)
Daily Session Note  Patient Details  Name: Anna Mooney MRN: [id number] Date of Birth: 04-Jan-1946 Referring Provider:   Flowsheet Row Pulmonary Rehab from 03/13/2021 in [facility] Cardiac and Pulmonary Rehab  Referring Provider Mosher       Encounter Date: 04/13/2021  Check In:  Session Check In - 04/13/21 1349       Check-In   Supervising physician immediately available to respond to emergencies See telemetry face sheet for immediately available ER MD    Location [facility]-Cardiac & Pulmonary Rehab    Staff Present Renita Papa, RN BSN;Joseph Tessie Fass, RCP,RRT,BSRT;Melissa [place], [place], LDN    Virtual Visit No    Medication changes reported     No    Fall or balance concerns reported    No    Warm-up and Cool-down Performed on first and last piece of equipment    Resistance Training Performed Yes    VAD Patient? No    PAD/SET Patient? No      Pain Assessment   Currently in Pain? No/denies                Social History   Tobacco Use  Smoking Status Former   Types: Cigarettes   Quit date: 2000   Years since quitting: 22.5  Smokeless Tobacco Never    Goals Met:  Independence with exercise equipment Exercise tolerated well No report of cardiac concerns or symptoms Strength training completed today  Goals Unmet:  Not Applicable  Comments: Pt able to follow exercise prescription today without complaint.  Will continue to monitor for progression.    Dr. Emily Filbert is Medical Director for [place].  Dr. Ottie Glazier is Medical Director for [facility] Pulmonary Rehabilitation.

## 2021-04-17 ENCOUNTER — Encounter: Payer: Medicare Other | Attending: Internal Medicine

## 2021-04-17 ENCOUNTER — Other Ambulatory Visit: Payer: Self-pay

## 2021-04-17 DIAGNOSIS — Z87891 Personal history of nicotine dependence: Secondary | ICD-10-CM | POA: Diagnosis not present

## 2021-04-17 DIAGNOSIS — J449 Chronic obstructive pulmonary disease, unspecified: Secondary | ICD-10-CM | POA: Insufficient documentation

## 2021-04-17 NOTE — Progress Notes (Signed)
Daily Session Note  Patient Details  Name: Anna Mooney MRN: [id number] Date of Birth: Mar 29, 1946 Referring Provider:   Flowsheet Row Pulmonary Rehab from 03/13/2021 in [facility] Cardiac and Pulmonary Rehab  Referring Provider Mosher       Encounter Date: 04/17/2021  Check In:  Session Check In - 04/17/21 1338       Check-In   Supervising physician immediately available to respond to emergencies See telemetry face sheet for immediately available ER MD    Location [facility]-Cardiac & Pulmonary Rehab    Staff Present Birdie Sons, MPA, Nino Glow, MS, ASCM CEP, Exercise Physiologist;Meredith Sherryll Burger, RN BSN    Virtual Visit No    Medication changes reported     No    Fall or balance concerns reported    No    Warm-up and Cool-down Performed on first and last piece of equipment    Resistance Training Performed Yes    VAD Patient? No    PAD/SET Patient? No      Pain Assessment   Currently in Pain? No/denies                Social History   Tobacco Use  Smoking Status Former   Types: Cigarettes   Quit date: 2000   Years since quitting: 22.5  Smokeless Tobacco Never    Goals Met:  Independence with exercise equipment Exercise tolerated well No report of cardiac concerns or symptoms Strength training completed today  Goals Unmet:  Not Applicable  Comments: Pt able to follow exercise prescription today without complaint.  Will continue to monitor for progression.    Dr. Emily Filbert is Medical Director for [place].  Dr. Ottie Glazier is Medical Director for [facility] Pulmonary Rehabilitation.

## 2021-04-19 ENCOUNTER — Other Ambulatory Visit: Payer: Self-pay

## 2021-04-19 ENCOUNTER — Encounter: Payer: Medicare Other | Admitting: *Deleted

## 2021-04-19 DIAGNOSIS — J449 Chronic obstructive pulmonary disease, unspecified: Secondary | ICD-10-CM | POA: Diagnosis not present

## 2021-04-19 NOTE — Progress Notes (Signed)
Daily Session Note  Patient Details  Name: Anna Mooney MRN: [id number] Date of Birth: 05/08/1946 Referring Provider:   Flowsheet Row Pulmonary Rehab from 03/13/2021 in [facility] Cardiac and Pulmonary Rehab  Referring Provider Mosher       Encounter Date: 04/19/2021  Check In:  Session Check In - 04/19/21 1555       Check-In   Supervising physician immediately available to respond to emergencies See telemetry face sheet for immediately available ER MD    Location [facility]-Cardiac & Pulmonary Rehab    Staff Present Kristen Coble, RN,BC,MSN;Melissa Caiola, RDN, LDN;Kara Langdon, MS, ASCM CEP, Exercise Physiologist    Virtual Visit No    Medication changes reported     No    Fall or balance concerns reported    No    Warm-up and Cool-down Performed on first and last piece of equipment    Resistance Training Performed Yes    VAD Patient? No    PAD/SET Patient? No      Pain Assessment   Currently in Pain? No/denies                Social History   Tobacco Use  Smoking Status Former   Types: Cigarettes   Quit date: 2000   Years since quitting: 22.6  Smokeless Tobacco Never    Goals Met:  Independence with exercise equipment Exercise tolerated well No report of cardiac concerns or symptoms Strength training completed today  Goals Unmet:  Not Applicable  Comments: Pt able to follow exercise prescription today without complaint.  Will continue to monitor for progression.    Dr. Mark Miller is Medical Director for [facility] Cardiac Rehabilitation.  Dr. Fuad Aleskerov is Medical Director for [facility] Pulmonary Rehabilitation. 

## 2021-04-20 ENCOUNTER — Encounter: Payer: Medicare Other | Admitting: *Deleted

## 2021-04-20 ENCOUNTER — Other Ambulatory Visit: Payer: Self-pay

## 2021-04-20 DIAGNOSIS — J449 Chronic obstructive pulmonary disease, unspecified: Secondary | ICD-10-CM | POA: Diagnosis not present

## 2021-04-20 NOTE — Progress Notes (Signed)
Daily Session Note  Patient Details  Name: Cloris Flippo MRN: [id number] Date of Birth: 08/14/1946 Referring Provider:   Flowsheet Row Pulmonary Rehab from 03/13/2021 in [facility]  Referring Provider [place]       Encounter Date: 04/20/2021  Check In:  Session Check In - 04/20/21 1326       Check-In   Supervising physician immediately available to respond to emergencies See telemetry face sheet for immediately available ER MD    Location [facility]-Cardiac & Pulmonary Rehab    Staff Present Renita Papa, RN BSN;Joseph [place], RCP,RRT,BSRT;Jessica [place], [place], RCEP, CCRP, CCET    Virtual Visit No    Medication changes reported     No    Fall or balance concerns reported    No    Warm-up and Cool-down Performed on first and last piece of equipment    Resistance Training Performed Yes    VAD Patient? No    PAD/SET Patient? No      Pain Assessment   Currently in Pain? No/denies                Social History   Tobacco Use  Smoking Status Former   Types: Cigarettes   Quit date: 2000   Years since quitting: 22.6  Smokeless Tobacco Never    Goals Met:  Independence with exercise equipment Exercise tolerated well No report of cardiac concerns or symptoms Strength training completed today  Goals Unmet:  Not Applicable  Comments: Pt able to follow exercise prescription today without complaint.  Will continue to monitor for progression.    Dr. Emily Filbert is Medical Director for [facility].  Dr. Ottie Glazier is Medical Director for [facility] Pulmonary Rehabilitation.

## 2021-04-24 ENCOUNTER — Other Ambulatory Visit: Payer: Self-pay

## 2021-04-24 DIAGNOSIS — J449 Chronic obstructive pulmonary disease, unspecified: Secondary | ICD-10-CM | POA: Diagnosis not present

## 2021-04-24 NOTE — Progress Notes (Signed)
Daily Session Note  Patient Details  Name: Anna Mooney MRN: [id number] Date of Birth: 10/31/1945 Referring Provider:   Flowsheet Row Pulmonary Rehab from 03/13/2021 in [facility] Cardiac and [facility]monary Rehab  Referring Provider Mosher       Encounter Date: 04/24/2021  Check In:  Session Check In - 04/24/21 1342       Check-In   Supervising physician immediately available to respond to emergencies See telemetry face sheet for immediately available ER MD    Location [facility]    Staff Present Kelly Bollinger, MPA, RN;Laureen Brown, BS, RRT, CPFT;Amanda Sommer, BA, ACSM CEP, Exercise Physiologist    Virtual Visit No    Medication changes reported     No    Fall or balance concerns reported    No    Warm-up and Cool-down Performed on first and last piece of equipment    Resistance Training Performed Yes    VAD Patient? No    PAD/SET Patient? No      Pain Assessment   Currently in Pain? No/denies                Social History   Tobacco Use  Smoking Status Former   Types: Cigarettes   Quit date: 2000   Years since quitting: 22.6  Smokeless Tobacco Never    Goals Met:  Independence with exercise equipment Exercise tolerated well No report of cardiac concerns or symptoms Strength training completed today  Goals Unmet:  Not Applicable  Comments: Pt able to follow exercise prescription today without complaint.  Will continue to monitor for progression.    Dr. Mark Miller is Medical Director for [facility].  Dr. Fuad Aleskerov is Medical Director for [facility]. 

## 2021-04-26 ENCOUNTER — Other Ambulatory Visit: Payer: Self-pay

## 2021-04-26 ENCOUNTER — Encounter: Payer: Self-pay | Admitting: *Deleted

## 2021-04-26 DIAGNOSIS — J449 Chronic obstructive pulmonary disease, unspecified: Secondary | ICD-10-CM

## 2021-04-26 NOTE — Progress Notes (Signed)
Daily Session Note  Patient Details  Name: Anna Mooney MRN: [id number] Date of Birth: 04/27/1946 Referring Provider:   Flowsheet Row Pulmonary Rehab from 03/13/2021 in [facility] Re[facility]  Referring Provider [place]       Encounter Date: 04/26/2021  Check In:  Session Check In - 04/26/21 1321       Check-In   Supervising physician immediately available to respond to emergencies See telemetry face sheet for immediately available ER MD    Location [facility]-Card[facility] & Pul[facility] Re[facility]    Staff Present Kelly Bollinger, MPA, RN;Kara Langdon, MS, ASCM CEP, Exercise Physiologist;Laureen Brown, BS, RRT, CPFT    Virtual Visit No    Medication changes reported     No    Fall or balance concerns reported    No    Warm-up and Cool-down Performed on first and last piece of equipment    Resistance Training Performed Yes    VAD Patient? No    PAD/SET Patient? No      Pain Assessment   Currently in Pain? No/denies                Social History   Tobacco Use  Smoking Status Former   Types: Cigarettes   Quit date: 2000   Years since quitting: 22.6  Smokeless Tobacco Never    Goals Met:  Independence with exercise equipment Exercise tolerated well No report of cardiac concerns or symptoms Strength training completed today  Goals Unmet:  Not Applicable  Comments: Pt able to follow exercise prescription today without complaint.  Will continue to monitor for progression.    Dr. Mark Miller is Medical Director for [facility].  Dr. Fuad Aleskerov is Medical Director for [facility]. 

## 2021-04-26 NOTE — Progress Notes (Signed)
Pulmonary Individual Treatment Plan  Patient Details  Name: Anna Mooney MRN: [id number] Date of Birth: Feb 22, 1946 Referring Provider:   Flowsheet Row Pulmonary Rehab from 03/13/2021 in [facility] Cardiac and Pulmonary Rehab  Referring Provider Mosher       Initial Encounter Date:  Flowsheet Row Pulmonary Rehab from 03/13/2021 in [facility] Cardiac and Pulmonary Rehab  Date 03/13/21       Visit Diagnosis: Chronic obstructive pulmonary disease, unspecified COPD type ([place])  Patient's Home Medications on Admission:  Current Outpatient Medications:    albuterol (VENTOLIN HFA) 108 (90 Base) MCG/ACT inhaler, Inhale into the lungs., Disp: , Rfl:    Calcium Carbonate-Vitamin D 600-200 MG-UNIT TABS, Take 1 tablet by mouth daily., Disp: , Rfl:    ibuprofen (ADVIL) 200 MG tablet, Take by mouth., Disp: , Rfl:    ipratropium-albuterol (DUONEB) 0.5-2.5 (3) MG/3ML SOLN, Inhale into the lungs., Disp: , Rfl:    Melatonin 10 MG TABS, Take by mouth., Disp: , Rfl:    montelukast (SINGULAIR) 10 MG tablet, SMARTSIG:1 Tablet(s) By Mouth Every Evening, Disp: , Rfl:    TRELEGY ELLIPTA 100-62.5-25 MCG/INH AEPB, Inhale 1 puff into the lungs daily., Disp: , Rfl:   Past Medical History: No past medical history on file.  Tobacco Use: Social History   Tobacco Use  Smoking Status Former   Types: Cigarettes   Quit date: 2000   Years since quitting: 22.6  Smokeless Tobacco Never    Labs: Recent Review Flowsheet Data   There is no flowsheet data to display.      Pulmonary Assessment Scores:  Pulmonary Assessment Scores     Row Name 03/09/21 1513 03/13/21 1403       ADL UCSD   SOB Score total 80 --    Rest 1 --    Walk 4 --    Stairs 5 --    Bath 4 --    Dress 3 --    Shop 4 --         CAT Score      CAT Score -- 18         mMRC Score      mMRC Score -- 3            UCSD: Self-administered rating of dyspnea associated with activities of daily living (ADLs) 6-point scale (0 = "not at all"  to 5 = "maximal or unable to do because of breathlessness")  Scoring Scores range from 0 to 120.  Minimally important difference is 5 units  CAT: CAT can identify the health impairment of COPD patients and is better correlated with disease progression.  CAT has a scoring range of zero to 40. The CAT score is classified into four groups of low (less than 10), medium (10 - 20), high (21-30) and very high (31-40) based on the impact level of disease on health status. A CAT score over 10 suggests significant symptoms.  A worsening CAT score could be explained by an exacerbation, poor medication adherence, poor inhaler technique, or progression of COPD or comorbid conditions.  CAT MCID is 2 points  mMRC: mMRC (Modified Medical Research Council) Dyspnea Scale is used to assess the degree of baseline functional disability in patients of respiratory disease due to dyspnea. No minimal important difference is established. A decrease in score of 1 point or greater is considered a positive change.   Pulmonary Function Assessment:   Exercise Target Goals: Exercise Program Goal: Individual exercise prescription set using results from initial 6 min walk  test and THRR while considering  patient's activity barriers and safety.   Exercise Prescription Goal: Initial exercise prescription builds to 30-45 minutes a day of aerobic activity, 2-3 days per week.  Home exercise guidelines will be given to patient during program as part of exercise prescription that the participant will acknowledge.  Education: Aerobic Exercise: - Group verbal and visual presentation on the components of exercise prescription. Introduces F.I.T.T principle from ACSM for exercise prescriptions.  Reviews F.I.T.T. principles of aerobic exercise including progression. Written material given at graduation.   Education: Resistance Exercise: - Group verbal and visual presentation on the components of exercise prescription. Introduces  F.I.T.T principle from ACSM for exercise prescriptions  Reviews F.I.T.T. principles of resistance exercise including progression. Written material given at graduation. Flowsheet Row Pulmonary Rehab from 04/19/2021 in [facility] Cardiac and Pulmonary Rehab  Date 03/15/21  Educator [facility]  Instruction Review Code 1- Verbalizes Understanding        Education: Exercise & Equipment Safety: - Individual verbal instruction and demonstration of equipment use and safety with use of the equipment. Flowsheet Row Pulmonary Rehab from 04/19/2021 in [facility] Cardiac and Pulmonary Rehab  Date 03/09/21  Educator [facility]  Instruction Review Code 1- Verbalizes Understanding       Education: Exercise Physiology & General Exercise Guidelines: - Group verbal and written instruction with models to review the exercise physiology of the cardiovascular system and associated critical values. Provides general exercise guidelines with specific guidelines to those with heart or lung disease.    Education: Flexibility, Balance, Mind/Body Relaxation: - Group verbal and visual presentation with interactive activity on the components of exercise prescription. Introduces F.I.T.T principle from ACSM for exercise prescriptions. Reviews F.I.T.T. principles of flexibility and balance exercise training including progression. Also discusses the mind body connection.  Reviews various relaxation techniques to help reduce and manage stress (i.e. Deep breathing, progressive muscle relaxation, and visualization). Balance handout provided to take home. Written material given at graduation. Flowsheet Row Pulmonary Rehab from 04/19/2021 in [facility] Cardiac and Pulmonary Rehab  Date 03/22/21  Educator [facility]  Instruction Review Code 1- Verbalizes Understanding       Activity Barriers & Risk Stratification:  Activity Barriers & Cardiac Risk Stratification - 03/03/21 1307       Activity Barriers & Cardiac Risk Stratification   Activity Barriers None              6 Minute Walk:  6 Minute Walk     Row Name 03/13/21 1403         6 Minute Walk   Phase Initial     Distance 500 feet     Walk Time 4.5 minutes     # of Rest Breaks 2     MPH 1.3     METS 2     RPE 17     Perceived Dyspnea  3     VO2 Peak 7.3     Symptoms Yes (comment)     Comments SOB     Resting HR 121 bpm     Resting BP 140/78     Resting Oxygen Saturation  96 %     Exercise Oxygen Saturation  during 6 min walk 92 %     Max Ex. HR 132 bpm     Max Ex. BP 154/84     2 Minute Post BP 118/80           Interval HR     1 Minute HR 121  2 Minute HR 129     3 Minute HR 132     4 Minute HR 127     5 Minute HR 124     6 Minute HR 126     2 Minute Post HR 121     Interval Heart Rate? Yes           Interval Oxygen     Interval Oxygen? Yes     Baseline Oxygen Saturation % 96 %     1 Minute Oxygen Saturation % 95 %     1 Minute Liters of Oxygen 2 L     2 Minute Oxygen Saturation % 93 %     2 Minute Liters of Oxygen 2 L     3 Minute Oxygen Saturation % 92 %     3 Minute Liters of Oxygen 2 L     4 Minute Oxygen Saturation % 94 %     4 Minute Liters of Oxygen 2 L     5 Minute Oxygen Saturation % 93 %     5 Minute Liters of Oxygen 2 L     6 Minute Oxygen Saturation % 92 %     6 Minute Liters of Oxygen 2 L     2 Minute Post Oxygen Saturation % 96 %     2 Minute Post Liters of Oxygen 2 L            Oxygen Initial Assessment:  Oxygen Initial Assessment - 03/03/21 1305       Home Oxygen   Home Oxygen Device Home Concentrator;Portable Concentrator    Sleep Oxygen Prescription Continuous    Liters per minute 2    Home Exercise Oxygen Prescription Continuous    Liters per minute 2    Home Resting Oxygen Prescription Continuous    Liters per minute 2    Compliance with Home Oxygen Use Yes      Intervention   Short Term Goals To learn and exhibit compliance with exercise, home and travel O2 prescription;To learn and understand importance of monitoring  SPO2 with pulse oximeter and demonstrate accurate use of the pulse oximeter.;To learn and understand importance of maintaining oxygen saturations>88%;To learn and demonstrate proper pursed lip breathing techniques or other breathing techniques. ;To learn and demonstrate proper use of respiratory medications    Long  Term Goals Exhibits compliance with exercise, home  and travel O2 prescription;Verbalizes importance of monitoring SPO2 with pulse oximeter and return demonstration;Maintenance of O2 saturations>88%;Exhibits proper breathing techniques, such as pursed lip breathing or other method taught during program session;Compliance with respiratory medication;Demonstrates proper use of MDI's             Oxygen Re-Evaluation:  Oxygen Re-Evaluation     Row Name 03/13/21 1419 04/06/21 1347           Program Oxygen Prescription   Program Oxygen Prescription -- Continuous      Liters per minute -- 2             Home Oxygen      Home Oxygen Device Home Concentrator;Portable Concentrator Home Concentrator;Portable Concentrator      Sleep Oxygen Prescription Continuous Continuous      Liters per minute 2 2      Home Exercise Oxygen Prescription Continuous Continuous      Liters per minute 2 2      Home Resting Oxygen Prescription Continuous Continuous      Liters per minute 2 2  Compliance with Home Oxygen Use Yes Yes             Goals/Expected Outcomes      Short Term Goals To learn and exhibit compliance with exercise, home and travel O2 prescription;To learn and understand importance of monitoring SPO2 with pulse oximeter and demonstrate accurate use of the pulse oximeter.;To learn and understand importance of maintaining oxygen saturations>88%;To learn and demonstrate proper pursed lip breathing techniques or other breathing techniques.  --      Long  Term Goals Exhibits compliance with exercise, home  and travel O2 prescription;Verbalizes importance of monitoring SPO2 with pulse  oximeter and return demonstration;Maintenance of O2 saturations>88%;Exhibits proper breathing techniques, such as pursed lip breathing or other method taught during program session --      Comments Reviewed PLB technique with pt.  Talked about how it works and it's importance in maintaining their exercise saturations. [name] feels she sees improvements in breathing some days but not consistently.      Goals/Expected Outcomes Short: Become more profiecient at using PLB.   Long: Become independent at using PLB. Short: continue to work on PLB Long: usegood PLB technique as needed              Oxygen Discharge (Final Oxygen Re-Evaluation):  Oxygen Re-Evaluation - 04/06/21 1347       Program Oxygen Prescription   Program Oxygen Prescription Continuous    Liters per minute 2      Home Oxygen   Home Oxygen Device Home Concentrator;Portable Concentrator    Sleep Oxygen Prescription Continuous    Liters per minute 2    Home Exercise Oxygen Prescription Continuous    Liters per minute 2    Home Resting Oxygen Prescription Continuous    Liters per minute 2    Compliance with Home Oxygen Use Yes      Goals/Expected Outcomes   Comments [name] feels she sees improvements in breathing some days but not consistently.    Goals/Expected Outcomes Short: continue to work on PLB Long: usegood PLB technique as needed             Initial Exercise Prescription:  Initial Exercise Prescription - 03/13/21 1400       Date of Initial Exercise RX and Referring Provider   Date 03/13/21    Referring Provider Mosher      Oxygen   Oxygen Continuous    Liters 2      Treadmill   MPH 1.3    Grade 0    Minutes 15   rest as needed   METs 2      Recumbant Bike   Level 1    RPM 60    Minutes 15    METs 2      NuStep   Level 1    SPM 80    Minutes 15    METs 2      T5 Nustep   Level 1    SPM 80    Minutes 15    METs 2      Biostep-RELP   Level 1    SPM 50    Minutes 15    METs 2       Prescription Details   Frequency (times per week) 3    Duration Progress to 30 minutes of continuous aerobic without signs/symptoms of physical distress      Intensity   THRR 40-80% of Max Heartrate 131-141    Ratings of Perceived Exertion 11-13  Perceived Dyspnea 0-4      Resistance Training   Training Prescription Yes    Weight 3 lb    Reps 10-15             Perform Capillary Blood Glucose checks as needed.  Exercise Prescription Changes:   Exercise Prescription Changes     Row Name 03/13/21 1400 03/27/21 1000 04/10/21 1500 04/25/21 1200       Response to Exercise   Blood Pressure (Admit) 140/78 150/84 120/64 140/76    Blood Pressure (Exercise) 154/84 142/86 -- --    Blood Pressure (Exit) 118/80 100/60 120/70 122/70    Heart Rate (Admit) 121 bpm 102 bpm 105 bpm 109 bpm    Heart Rate (Exercise) 132 bpm 109 bpm 113 bpm 113 bpm    Heart Rate (Exit) 121 bpm 99 bpm 111 bpm 98 bpm    Oxygen Saturation (Admit) 96 % 95 % 97 % 96 %    Oxygen Saturation (Exercise) 92 % 93 % 97 % 96 %    Oxygen Saturation (Exit) 96 % 99 % 97 % 98 %    Rating of Perceived Exertion (Exercise) 17 13 15 12     Perceived Dyspnea (Exercise) 3 2 2 3     Symptoms SOB SOB SOB SOB,fatigue    Comments -- first full day of exercise -- --    Duration -- Progress to 30 minutes of  aerobic without signs/symptoms of physical distress Continue with 30 min of aerobic exercise without signs/symptoms of physical distress. Continue with 30 min of aerobic exercise without signs/symptoms of physical distress.    Intensity -- THRR unchanged THRR unchanged THRR unchanged         Progression        Progression -- Continue to progress workloads to maintain intensity without signs/symptoms of physical distress. Continue to progress workloads to maintain intensity without signs/symptoms of physical distress. Continue to progress workloads to maintain intensity without signs/symptoms of physical distress.    Average METs  -- 1.77 1.93 1.8         Resistance Training        Training Prescription -- Yes Yes Yes    Weight -- 3 lb 3 lb 3 lb    Reps -- 10-15 10-15 10-15         Interval Training        Interval Training -- No No No         Oxygen        Oxygen -- Continuous Continuous Continuous    Liters -- 2 2 2          Treadmill        MPH -- 1.3 1.3 --    Grade -- 0 0 --    Minutes -- 15 15 --    METs -- 2 2 --         NuStep        Level -- 3 3 3     Minutes -- 15 15 15     METs -- 1.8 2 1.8         T5 Nustep        Level -- -- 1 --    Minutes -- -- 15 --         Biostep-RELP        Level -- -- 1 --    Minutes -- -- 15 --    METs -- -- 2 --         Home  Exercise Plan        Plans to continue exercise at -- -- Home (comment)  walking, treadmill Home (comment)  walking, treadmill    Frequency -- -- Add 2 additional days to program exercise sessions. Add 2 additional days to program exercise sessions.    Initial Home Exercises Provided -- -- 04/06/21 04/06/21            Exercise Comments:   Exercise Comments     Row Name 03/13/21 1418           Exercise Comments First full day of exercise!  Patient was oriented to gym and equipment including functions, settings, policies, and procedures.  Patient's individual exercise prescription and treatment plan were reviewed.  All starting workloads were established based on the results of the 6 minute walk test done at initial orientation visit.  The plan for exercise progression was also introduced and progression will be customized based on patient's performance and goals.                Exercise Goals and Review:   Exercise Goals     Row Name 03/13/21 1409             Exercise Goals   Increase Physical Activity Yes       Intervention Provide advice, education, support and counseling about physical activity/exercise needs.;Develop an individualized exercise prescription for aerobic and resistive training based on  initial evaluation findings, risk stratification, comorbidities and participant's personal goals.       Expected Outcomes Short Term: Attend rehab on a regular basis to increase amount of physical activity.;Long Term: Add in home exercise to make exercise part of routine and to increase amount of physical activity.;Long Term: Exercising regularly at least 3-5 days a week.       Increase Strength and Stamina Yes       Intervention Provide advice, education, support and counseling about physical activity/exercise needs.;Develop an individualized exercise prescription for aerobic and resistive training based on initial evaluation findings, risk stratification, comorbidities and participant's personal goals.       Expected Outcomes Short Term: Increase workloads from initial exercise prescription for resistance, speed, and METs.;Short Term: Perform resistance training exercises routinely during rehab and add in resistance training at home;Long Term: Improve cardiorespiratory fitness, muscular endurance and strength as measured by increased METs and functional capacity (6MWT)       Able to understand and use rate of perceived exertion (RPE) scale Yes       Intervention Provide education and explanation on how to use RPE scale       Expected Outcomes Short Term: Able to use RPE daily in rehab to express subjective intensity level;Long Term:  Able to use RPE to guide intensity level when exercising independently       Able to understand and use Dyspnea scale Yes       Intervention Provide education and explanation on how to use Dyspnea scale       Expected Outcomes Short Term: Able to use Dyspnea scale daily in rehab to express subjective sense of shortness of breath during exertion;Long Term: Able to use Dyspnea scale to guide intensity level when exercising independently       Knowledge and understanding of Target Heart Rate Range (THRR) Yes       Intervention Provide education and explanation of THRR  including how the numbers were predicted and where they are located for reference       Expected Outcomes  Short Term: Able to state/look up THRR;Long Term: Able to use THRR to govern intensity when exercising independently;Short Term: Able to use daily as guideline for intensity in rehab       Able to check pulse independently Yes       Intervention Provide education and demonstration on how to check pulse in carotid and radial arteries.;Review the importance of being able to check your own pulse for safety during independent exercise       Expected Outcomes Short Term: Able to explain why pulse checking is important during independent exercise       Understanding of Exercise Prescription Yes       Intervention Provide education, explanation, and written materials on patient's individual exercise prescription       Expected Outcomes Short Term: Able to explain program exercise prescription;Long Term: Able to explain home exercise prescription to exercise independently       Improve claudication pain toleration; Improve walking ability Yes       Intervention Participate in PAD/SET Rehab 2-3 days a week, walking at home as part of exercise prescription;Attend education sessions to aid in risk factor modification and understanding of disease process       Expected Outcomes Short Term: Improve walking distance/time to onset of claudication pain;Long Term: Improve score of PAD questionnaires                Exercise Goals Re-Evaluation :  Exercise Goals Re-Evaluation     Row Name 03/13/21 1418 03/27/21 1012 04/06/21 1402 04/10/21 1524 04/25/21 1235     Exercise Goal Re-Evaluation   Exercise Goals Review Increase Physical Activity;Knowledge and understanding of Target Heart Rate Range (THRR);Able to understand and use rate of perceived exertion (RPE) scale;Understanding of Exercise Prescription;Increase Strength and Stamina;Able to understand and use Dyspnea scale;Able to check pulse independently  Increase Physical Activity;Increase Strength and Stamina Increase Physical Activity;Increase Strength and Stamina Increase Physical Activity;Increase Strength and Stamina;Understanding of Exercise Prescription Increase Physical Activity;Increase Strength and Stamina   Comments Reviewed RPE and dyspnea scales, THR and program prescription with pt today.  Pt voiced understanding and was given a copy of goals to take home. Carma is doing well her first couple of weeks of being at rehab. She has already increased to level 3 on the T4 NuStep. She is building up tolerance on the treadmill and oxygen saturations are staying above 88% during exercise. Will continue to monitor. Reviewed home exercise with pt today.  Pt plans to walk TM and use bike for exercise.  Reviewed THR, pulse, RPE, sign and symptoms, pulse oximetery and when to call 911 or MD.  Also discussed weather considerations and indoor options.  Pt voiced understanding. Nena Jordan is doing well in rehab.  She is enjoying the treadmill and up to the full 15 minutes on there.  We will continue to monitor her progress. Nena Jordan has somedays that are harder breathing and does only seated equipment.  Oxygen stays in the 90s.  Staff will monitor progress.   Expected Outcomes Short: Use RPE daily to regulate intensity. Long: Follow program prescription in THR. Short: Continue to increase speed on treadmill Long: Continue to increase overall strength and stamina Short: monitor HR an dO2 while exercising at home Long: continue to build stamina Short: Make sure to put in weight and maintain spm on steppers Long: continue to improve stamia Short:  continue to wok on TM Long:  build stamina and improve SOB  Discharge Exercise Prescription (Final Exercise Prescription Changes):  Exercise Prescription Changes - 04/25/21 1200       Response to Exercise   Blood Pressure (Admit) 140/76    Blood Pressure (Exit) 122/70    Heart Rate (Admit) 109 bpm    Heart Rate  (Exercise) 113 bpm    Heart Rate (Exit) 98 bpm    Oxygen Saturation (Admit) 96 %    Oxygen Saturation (Exercise) 96 %    Oxygen Saturation (Exit) 98 %    Rating of Perceived Exertion (Exercise) 12    Perceived Dyspnea (Exercise) 3    Symptoms SOB,fatigue    Duration Continue with 30 min of aerobic exercise without signs/symptoms of physical distress.    Intensity THRR unchanged      Progression   Progression Continue to progress workloads to maintain intensity without signs/symptoms of physical distress.    Average METs 1.8      Resistance Training   Training Prescription Yes    Weight 3 lb    Reps 10-15      Interval Training   Interval Training No      Oxygen   Oxygen Continuous    Liters 2      NuStep   Level 3    Minutes 15    METs 1.8      Home Exercise Plan   Plans to continue exercise at Home (comment)   walking, treadmill   Frequency Add 2 additional days to program exercise sessions.    Initial Home Exercises Provided 04/06/21             Nutrition:  Target Goals: Understanding of nutrition guidelines, daily intake of sodium <1569m, cholesterol <2062m calories 30% from fat and 7% or less from saturated fats, daily to have 5 or more servings of fruits and vegetables.  Education: All About Nutrition: -Group instruction provided by verbal, written material, interactive activities, discussions, models, and posters to present general guidelines for heart healthy nutrition including fat, fiber, MyPlate, the role of sodium in heart healthy nutrition, utilization of the nutrition label, and utilization of this knowledge for meal planning. Follow up email sent as well. Written material given at graduation. [facility]  Date 03/29/21  [facility] 1- Verbalizes Understanding       Biometrics:  Pre Biometrics - 03/13/21 1410       Pre Biometrics   Height 5' 6"   (1.676 m)    Weight 139 lb 14.4 oz (63.5 kg)    BMI (Calculated) 22.59    Single Leg Stand 6.34 seconds              Nutrition Therapy Plan and Nutrition Goals:  Nutrition Therapy & Goals - 04/03/21 1511       Nutrition Therapy   Diet Heart healthy, low Na, pulmonary friendly    Protein (specify units) 75g    Fiber 25 grams    Whole Grain Foods 3 servings    Saturated Fats 12 max. grams    Fruits and Vegetables 8 servings/day    Sodium 1.5 grams      Personal Nutrition Goals   Nutrition Goal ST: add in protein snack like apple with peanut butter, pt is on the fence regarding protein shake, wants to try out prepared meals LT: limit Na < 1.5g/day, eat enough protein    Comments [name] reports having a poor appetite and will make herself eat.  She used to salt everything she ate, but no longer does this. She continues to go out to eat 2x/day breakfast and lunch. Berniece Salines is her favorite food- for breakfast yesterday she had bacon and egg and an apple and for dinner a salad - she threw out the fried chicken. She enjoys beans and does not care for meat much anymore. She reports not wanting to prepare meals and would be interested in a meal kit. Discussed some options she could try and the pros/cons of doing so. Discussed heart healthy and pulmonary friendly eating.      Intervention Plan   Intervention Prescribe, educate and counsel regarding individualized specific dietary modifications aiming towards targeted core components such as weight, hypertension, lipid management, diabetes, heart failure and other comorbidities.;Nutrition handout(s) given to patient.    Expected Outcomes Short Term Goal: Understand basic principles of dietary content, such as calories, fat, sodium, cholesterol and nutrients.;Short Term Goal: A plan has been developed with personal nutrition goals set during dietitian appointment.;Long Term Goal: Adherence to prescribed nutrition plan.             Nutrition  Assessments:  MEDIFICTS Score Key: ?70 Need to make dietary changes  40-70 Heart Healthy Diet ? 40 Therapeutic Level Cholesterol Diet  Flowsheet Row Pulmonary Rehab from 03/09/2021 in [facility] Cardiac and Pulmonary Rehab  Picture Your Plate Total Score on Admission 45      Picture Your Plate Scores: <38 Unhealthy dietary pattern with much room for improvement. 41-50 Dietary pattern unlikely to meet recommendations for good health and room for improvement. 51-60 More healthful dietary pattern, with some room for improvement.  >60 Healthy dietary pattern, although there may be some specific behaviors that could be improved.   Nutrition Goals Re-Evaluation:   Nutrition Goals Discharge (Final Nutrition Goals Re-Evaluation):   Psychosocial: Target Goals: Acknowledge presence or absence of significant depression and/or stress, maximize coping skills, provide positive support system. Participant is able to verbalize types and ability to use techniques and skills needed for reducing stress and depression.   Education: Stress, Anxiety, and Depression - Group verbal and visual presentation to define topics covered.  Reviews how body is impacted by stress, anxiety, and depression.  Also discusses healthy ways to reduce stress and to treat/manage anxiety and depression.  Written material given at graduation.   Education: Sleep Hygiene -Provides group verbal and written instruction about how sleep can affect your health.  Define sleep hygiene, discuss sleep cycles and impact of sleep habits. Review good sleep hygiene tips.    Initial Review & Psychosocial Screening:  Initial Psych Review & Screening - 03/03/21 1312       Initial Review   Current issues with Current Stress Concerns;Current Sleep Concerns    Source of Stress Concerns Unable to participate in former interests or hobbies;Unable to perform yard/household activities      Johnson? Yes   nieces,  nephews, brother     Barriers   Psychosocial barriers to participate in program There are no identifiable barriers or psychosocial needs.      Screening Interventions   Interventions Encouraged to exercise;Provide feedback about the scores to participant;To provide support and resources with identified psychosocial needs    Expected Outcomes Short Term goal: Utilizing psychosocial counselor, staff and physician to assist with identification of specific Stressors or current issues interfering with healing process. Setting desired goal for each stressor or current issue identified.;Long Term Goal: Stressors or current issues are controlled  or eliminated.;Short Term goal: Identification and review with participant of any Quality of Life or Depression concerns found by scoring the questionnaire.;Long Term goal: The participant improves quality of Life and PHQ9 Scores as seen by post scores and/or verbalization of changes             Quality of Life Scores:  Scores of 19 and below usually indicate a poorer quality of life in these areas.  A difference of  2-3 points is a clinically meaningful difference.  A difference of 2-3 points in the total score of the Quality of Life Index has been associated with significant improvement in overall quality of life, self-image, physical symptoms, and general health in studies assessing change in quality of life.  PHQ-9: Recent Review Flowsheet Data     Depression screen [facility] 2/9 04/03/2021 03/09/2021   Decreased Interest 0 1   Down, Depressed, Hopeless 0 0   PHQ - 2 Score 0 1   Altered sleeping 1 3   Tired, decreased energy 1 3   Change in appetite 1 2   Feeling bad or failure about yourself  0 0   Trouble concentrating 0 0   Moving slowly or fidgety/restless 0 0   Suicidal thoughts 0 0   PHQ-9 Score 3 9   Difficult doing work/chores Not difficult at all -      Interpretation of Total Score  Total Score Depression Severity:  1-4 = Minimal  depression, 5-9 = Mild depression, 10-14 = Moderate depression, 15-19 = Moderately severe depression, 20-27 = Severe depression   Psychosocial Evaluation and Intervention:  Psychosocial Evaluation - 03/03/21 1319       Psychosocial Evaluation & Interventions   Interventions Encouraged to exercise with the program and follow exercise prescription    Comments Ms. Bulluck is coming to pulmonary rehab for COPD in hopes this will help her breathing. She lives alone, with nieces, nephews and a brother close by, and wishes to maintain her independence as long as possible. She started wearing oxygen in March and is compliant with it. Her appetite is a struggle in that when she finally is hungry and makes food she only eats a little of it. She states she feels like her stress has gone down now that one of her brothers is no longer suffering with dementia- which was very difficult to watch for her. She does wish she could do more than just sit at home and is hopeful this program will change that.    Expected Outcomes Short: attend pulmonary rehab for education and exercise. Long: develop positive health care habits.             Psychosocial Re-Evaluation:  Psychosocial Re-Evaluation     [place] Name 04/03/21 1417             Psychosocial Re-Evaluation   Comments Reviewed patient health questionnaire (PHQ-9) with patient for follow up. Previously, patients score indicated signs/symptoms of depression.  Reviewed to see if patient is improving symptom wise while in program.  Score improved/declined and patient states that it is because she has been able to exercise more and has more energy.       Expected Outcomes Short: Continue to attend [facility] regularly for regular exercise and social engagement. Long: Continue to improve symptoms and manage a positive mental state.       Interventions Encouraged to attend Pulmonary Rehabilitation for the exercise       Continue Psychosocial Services  No Follow up  required  Psychosocial Discharge (Final Psychosocial Re-Evaluation):  Psychosocial Re-Evaluation - 04/03/21 1417       Psychosocial Re-Evaluation   Comments Reviewed patient health questionnaire (PHQ-9) with patient for follow up. Previously, patients score indicated signs/symptoms of depression.  Reviewed to see if patient is improving symptom wise while in program.  Score improved/declined and patient states that it is because she has been able to exercise more and has more energy.    Expected Outcomes Short: Continue to attend [facility] regularly for regular exercise and social engagement. Long: Continue to improve symptoms and manage a positive mental state.    Interventions Encouraged to attend Pulmonary Rehabilitation for the exercise    Continue Psychosocial Services  No Follow up required             Education: Education Goals: Education classes will be provided on a weekly basis, covering required topics. Participant will state understanding/return demonstration of topics presented.  Learning Barriers/Preferences:  Learning Barriers/Preferences - 03/03/21 1314       Learning Barriers/Preferences   Learning Barriers None    Learning Preferences None             General Pulmonary Education Topics:  Infection Prevention: - Provides verbal and written material to individual with discussion of infection control including proper hand washing and proper equipment cleaning during exercise session. Flowsheet Row Pulmonary Rehab from 04/19/2021 in [facility] Cardiac and Pulmonary Rehab  Date 03/09/21  Educator AS  Instruction Review Code 1- Verbalizes Understanding       Falls Prevention: - Provides verbal and written material to individual with discussion of falls prevention and safety.   Chronic Lung Disease Review: - Group verbal instruction with posters, models, PowerPoint presentations and videos,  to review new updates, new respiratory medications,  new advancements in procedures and treatments. Providing information on websites and "800" numbers for continued self-education. Includes information about supplement oxygen, available portable oxygen systems, continuous and intermittent flow rates, oxygen safety, concentrators, and Medicare reimbursement for oxygen. Explanation of Pulmonary Drugs, including class, frequency, complications, importance of spacers, rinsing mouth after steroid MDI's, and proper cleaning methods for nebulizers. Review of basic lung anatomy and physiology related to function, structure, and complications of lung disease. Review of risk factors. Discussion about methods for diagnosing sleep apnea and types of masks and machines for OSA. Includes a review of the use of types of environmental controls: home humidity, furnaces, filters, dust mite/pet prevention, HEPA vacuums. Discussion about weather changes, air quality and the benefits of nasal washing. Instruction on Warning signs, infection symptoms, calling MD promptly, preventive modes, and value of vaccinations. Review of effective airway clearance, coughing and/or vibration techniques. Emphasizing that all should Create an Action Plan. Written material given at graduation. Flowsheet Row Pulmonary Rehab from 04/19/2021 in [facility] Cardiac and Pulmonary Rehab  Date 04/19/21  Educator [facility]  Instruction Review Code 1- Verbalizes Understanding       AED/CPR: - Group verbal and written instruction with the use of models to demonstrate the basic use of the AED with the basic ABC's of resuscitation.    Anatomy and Cardiac Procedures: - Group verbal and visual presentation and models provide information about basic cardiac anatomy and function. Reviews the testing methods done to diagnose heart disease and the outcomes of the test results. Describes the treatment choices: Medical Management, Angioplasty, or Coronary Bypass Surgery for treating various heart conditions including  Myocardial Infarction, Angina, Valve Disease, and Cardiac Arrhythmias.  Written material given at graduation. Flowsheet Row Pulmonary Rehab  from 04/19/2021 in [facility] Cardiac and Pulmonary Rehab  Date 03/15/21  Educator [facility]  Instruction Review Code 1- Verbalizes Understanding       Medication Safety: - Group verbal and visual instruction to review commonly prescribed medications for heart and lung disease. Reviews the medication, class of the drug, and side effects. Includes the steps to properly store meds and maintain the prescription regimen.  Written material given at graduation.   Other: -Provides group and verbal instruction on various topics (see comments)   Knowledge Questionnaire Score:    Core Components/Risk Factors/Patient Goals at Admission:  Personal Goals and Risk Factors at Admission - 03/13/21 1405       Core Components/Risk Factors/Patient Goals on Admission   Improve shortness of breath with ADL's Yes    Intervention Provide education, individualized exercise plan and daily activity instruction to help decrease symptoms of SOB with activities of daily living.    Expected Outcomes Short Term: Improve cardiorespiratory fitness to achieve a reduction of symptoms when performing ADLs;Long Term: Be able to perform more ADLs without symptoms or delay the onset of symptoms    Increase knowledge of respiratory medications and ability to use respiratory devices properly  Yes    Intervention Provide education and demonstration as needed of appropriate use of medications, inhalers, and oxygen therapy.    Expected Outcomes Short Term: Achieves understanding of medications use. Understands that oxygen is a medication prescribed by physician. Demonstrates appropriate use of inhaler and oxygen therapy.;Long Term: Maintain appropriate use of medications, inhalers, and oxygen therapy.             Education:Diabetes - Individual verbal and written instruction to review  signs/symptoms of diabetes, desired ranges of glucose level fasting, after meals and with exercise. Acknowledge that pre and post exercise glucose checks will be done for 3 sessions at entry of program.   Know Your Numbers and Heart Failure: - Group verbal and visual instruction to discuss disease risk factors for cardiac and pulmonary disease and treatment options.  Reviews associated critical values for Overweight/Obesity, Hypertension, Cholesterol, and Diabetes.  Discusses basics of heart failure: signs/symptoms and treatments.  Introduces Heart Failure Zone chart for action plan for heart failure.  Written material given at graduation. Flowsheet Row Pulmonary Rehab from 04/19/2021 in [facility] Cardiac and Pulmonary Rehab  Date 04/12/21  Educator [facility]  Instruction Review Code 1- Verbalizes Understanding       Core Components/Risk Factors/Patient Goals Review:   Goals and Risk Factor Review     Row Name 04/06/21 1343             Core Components/Risk Factors/Patient Goals Review   Personal Goals Review Improve shortness of breath with ADL's;Increase knowledge of respiratory medications and ability to use respiratory devices properly.       Review Nena Jordan says she has seen some improvement in her breathing - not consistently.  She feels her energy is better.  She does practice PLB.  She doesnt check oxygen at home but plans to get one.  She does take medications as directed.       Expected Outcomes Short: continue to take meds and work on PLB Long:  improve breathing with ADLs                Core Components/Risk Factors/Patient Goals at Discharge (Final Review):   Goals and Risk Factor Review - 04/06/21 1343       Core Components/Risk Factors/Patient Goals Review   Personal Goals Review Improve shortness of  breath with ADL's;Increase knowledge of respiratory medications and ability to use respiratory devices properly.    Review Nena Jordan says she has seen some improvement in her breathing - not  consistently.  She feels her energy is better.  She does practice PLB.  She doesnt check oxygen at home but plans to get one.  She does take medications as directed.    Expected Outcomes Short: continue to take meds and work on PLB Long:  improve breathing with ADLs             ITP Comments:  ITP Comments     Row Name 03/03/21 1324 03/13/21 1413 03/13/21 1417 03/29/21 1121 04/26/21 0753   ITP Comments Initial telephone orientation completed. Diagnosis can be found in [facility] 5/16. EP orientation scheduled for Thursday 6/23 at 2pm. Completed 6MWT and gym orientation. Initial ITP created and sent for review to Dr Ottie Glazier, Medical Director. First full day of exercise!  Patient was oriented to gym and equipment including functions, settings, policies, and procedures.  Patient's individual exercise prescription and treatment plan were reviewed.  All starting workloads were established based on the results of the 6 minute walk test done at initial orientation visit.  The plan for exercise progression was also introduced and progression will be customized based on patient's performance and goals. 30 Day review completed. Medical Director ITP review done, changes made as directed, and signed approval by Medical Director.   New to program 30 Day review completed. Medical Director ITP review done, changes made as directed, and signed approval by Medical Director.            Comments:

## 2021-04-27 ENCOUNTER — Other Ambulatory Visit: Payer: Self-pay

## 2021-04-27 DIAGNOSIS — J449 Chronic obstructive pulmonary disease, unspecified: Secondary | ICD-10-CM | POA: Diagnosis not present

## 2021-04-27 NOTE — Progress Notes (Signed)
Daily Session Note  Patient Details  Name: Anna Mooney MRN: [id number] Date of Birth: 28-Apr-1946 Referring Provider:   Flowsheet Row Pulmonary Rehab from 03/13/2021 in [facility] Cardiac and Pulmonary Rehab  Referring Provider [place]       Encounter Date: 04/27/2021  Check In:  Session Check In - 04/27/21 1336       Check-In   Supervising physician immediately available to respond to emergencies See telemetry face sheet for immediately available ER MD    Location [facility]-Cardiac & Pulmonary Rehab    Staff Present Birdie Sons, MPA, RN;Melissa McKenney, RDN, LDN;Jessica Luan Pulling, MA, RCEP, CCRP, CCET    Virtual Visit No    Medication changes reported     No    Fall or balance concerns reported    No    Warm-up and Cool-down Performed on first and last piece of equipment    Resistance Training Performed Yes    VAD Patient? No    PAD/SET Patient? No      Pain Assessment   Currently in Pain? No/denies                Social History   Tobacco Use  Smoking Status Former   Types: Cigarettes   Quit date: 2000   Years since quitting: 22.6  Smokeless Tobacco Never    Goals Met:  Independence with exercise equipment Exercise tolerated well No report of cardiac concerns or symptoms Strength training completed today  Goals Unmet:  Not Applicable  Comments: Pt able to follow exercise prescription today without complaint.  Will continue to monitor for progression.    Dr. Emily Filbert is Medical Director for [place].  Dr. Ottie Glazier is Medical Director for [facility] Pulmonary Rehabilitation.

## 2021-05-01 ENCOUNTER — Other Ambulatory Visit: Payer: Self-pay

## 2021-05-01 DIAGNOSIS — J449 Chronic obstructive pulmonary disease, unspecified: Secondary | ICD-10-CM

## 2021-05-01 NOTE — Progress Notes (Signed)
Daily Session Note  Patient Details  Name: Cloa Bushong MRN: [id number] Date of Birth: Apr 19, 1946 Referring Provider:   Flowsheet Row Pulmonary Rehab from 03/13/2021 in [facility] Cardiac and Pulmonary Rehab  Referring Provider [place]       Encounter Date: 05/01/2021  Check In:  Session Check In - 05/01/21 1333       Check-In   Supervising physician immediately available to respond to emergencies See telemetry face sheet for immediately available ER MD    Location [facility]-Cardiac & Pulmonary Rehab    Staff Present Birdie Sons, MPA, Nino Glow, MS, ASCM CEP, Exercise Physiologist;Joseph Tessie Fass, [place]    Virtual Visit No    Medication changes reported     No    Fall or balance concerns reported    No    Warm-up and Cool-down Performed on first and last piece of equipment    Resistance Training Performed Yes    VAD Patient? No    PAD/SET Patient? No      Pain Assessment   Currently in Pain? No/denies                Social History   Tobacco Use  Smoking Status Former   Types: Cigarettes   Quit date: 2000   Years since quitting: 22.6  Smokeless Tobacco Never    Goals Met:  Independence with exercise equipment Exercise tolerated well No report of cardiac concerns or symptoms Strength training completed today  Goals Unmet:  Not Applicable  Comments: Pt able to follow exercise prescription today without complaint.  Will continue to monitor for progression.    Dr. Emily Filbert is Medical Director for [place].  Dr. Ottie Glazier is Medical Director for [facility] Pulmonary Rehabilitation.

## 2021-05-03 ENCOUNTER — Other Ambulatory Visit: Payer: Self-pay

## 2021-05-03 DIAGNOSIS — J449 Chronic obstructive pulmonary disease, unspecified: Secondary | ICD-10-CM

## 2021-05-03 NOTE — Progress Notes (Signed)
Daily Session Note  Patient Details  Name: Anna Mooney MRN: [id number] Date of Birth: 1946/08/27 Referring Provider:   Flowsheet Row Pulmonary Rehab from 03/13/2021 in [facility] Cardiac and Pulmonary Rehab  Referring Provider [place]       Encounter Date: 05/03/2021  Check In:  Session Check In - 05/03/21 1329       Check-In   Supervising physician immediately available to respond to emergencies See telemetry face sheet for immediately available ER MD    Location [facility]-Cardiac & Pulmonary Rehab    Staff Present Birdie Sons, MPA, Nino Glow, MS, ASCM CEP, Exercise Physiologist;Joseph Tessie Fass, [place]    Virtual Visit No    Medication changes reported     No    Fall or balance concerns reported    No    Warm-up and Cool-down Performed on first and last piece of equipment    Resistance Training Performed Yes    VAD Patient? No    PAD/SET Patient? No      Pain Assessment   Currently in Pain? No/denies                Social History   Tobacco Use  Smoking Status Former   Types: Cigarettes   Quit date: 2000   Years since quitting: 22.6  Smokeless Tobacco Never    Goals Met:  Independence with exercise equipment Exercise tolerated well No report of cardiac concerns or symptoms Strength training completed today  Goals Unmet:  Not Applicable  Comments: Pt able to follow exercise prescription today without complaint.  Will continue to monitor for progression.    Dr. Emily Filbert is Medical Director for [place].  Dr. Ottie Glazier is Medical Director for [facility] Pulmonary Rehabilitation.

## 2021-05-04 ENCOUNTER — Other Ambulatory Visit: Payer: Self-pay

## 2021-05-04 ENCOUNTER — Encounter: Payer: Medicare Other | Admitting: *Deleted

## 2021-05-04 DIAGNOSIS — J449 Chronic obstructive pulmonary disease, unspecified: Secondary | ICD-10-CM | POA: Diagnosis not present

## 2021-05-04 NOTE — Progress Notes (Signed)
Daily Session Note  Patient Details  Name: Anna Mooney MRN: [id number] Date of Birth: [birth year] Referring Provider:   Flowsheet Row Pulmonary Rehab from 03/13/2021 in [facility] Cardiac and Pulmonary Rehab  Referring Provider [place]       Encounter Date: 05/04/2021  Check In:  Session Check In - 05/04/21 1336       Check-In   Supervising physician immediately available to respond to emergencies See telemetry face sheet for immediately available ER MD    Location [facility]-Cardiac & Pulmonary Rehab    Staff Present Renita Papa, RN BSN;Joseph Tessie Fass, RCP,RRT,BSRT;Melissa Moyers, [place], LDN    Virtual Visit No    Medication changes reported     No    Fall or balance concerns reported    No    Warm-up and Cool-down Performed on first and last piece of equipment    Resistance Training Performed Yes    VAD Patient? No    PAD/SET Patient? No      Pain Assessment   Currently in Pain? No/denies                Social History   Tobacco Use  Smoking Status Former   Types: Cigarettes   Quit date: 2000   Years since quitting: 22.6  Smokeless Tobacco Never    Goals Met:  Independence with exercise equipment Exercise tolerated well No report of cardiac concerns or symptoms Strength training completed today  Goals Unmet:  Not Applicable  Comments: Pt able to follow exercise prescription today without complaint.  Will continue to monitor for progression.    Dr. Emily Filbert is Medical Director for [place].  Dr. Ottie Glazier is Medical Director for [facility] Pulmonary Rehabilitation.

## 2021-05-08 ENCOUNTER — Other Ambulatory Visit: Payer: Self-pay

## 2021-05-08 DIAGNOSIS — J449 Chronic obstructive pulmonary disease, unspecified: Secondary | ICD-10-CM

## 2021-05-08 NOTE — Progress Notes (Signed)
Daily Session Note  Patient Details  Name: Anna Mooney MRN: [id number] Date of Birth: Mar 14, 1946 Referring Provider:   Flowsheet Row Pulmonary Rehab from 03/13/2021 in [facility] Cardiac and Pulmonary Rehab  Referring Provider Mosher       Encounter Date: 05/08/2021  Check In:  Session Check In - 05/08/21 1347       Check-In   Supervising physician immediately available to respond to emergencies See telemetry face sheet for immediately available ER MD    Location [facility]-Cardiac & Pulmonary Rehab    Staff Present Birdie Sons, MPA, Nino Glow, MS, ASCM CEP, Exercise Physiologist;Joseph Tessie Fass, [place]    Virtual Visit No    Medication changes reported     No    Fall or balance concerns reported    No    Warm-up and Cool-down Performed on first and last piece of equipment    Resistance Training Performed Yes    VAD Patient? No    PAD/SET Patient? No      Pain Assessment   Currently in Pain? No/denies                Social History   Tobacco Use  Smoking Status Former   Types: Cigarettes   Quit date: 2000   Years since quitting: 22.6  Smokeless Tobacco Never    Goals Met:  Independence with exercise equipment Exercise tolerated well No report of cardiac concerns or symptoms Strength training completed today  Goals Unmet:  Not Applicable  Comments: Pt able to follow exercise prescription today without complaint.  Will continue to monitor for progression.    Dr. Emily Filbert is Medical Director for [place].  Dr. Ottie Glazier is Medical Director for [facility] Pulmonary Rehabilitation.

## 2021-05-10 ENCOUNTER — Other Ambulatory Visit: Payer: Self-pay

## 2021-05-10 DIAGNOSIS — J449 Chronic obstructive pulmonary disease, unspecified: Secondary | ICD-10-CM

## 2021-05-10 NOTE — Progress Notes (Signed)
Daily Session Note  Patient Details  Name: Anna Mooney MRN: [id number] Date of Birth: 1946-07-11 Referring Provider:   Flowsheet Row Pulmonary Rehab from 03/13/2021 in [facility] Cardiac and Pulmonary Rehab  Referring Provider Mosher       Encounter Date: 05/10/2021  Check In:  Session Check In - 05/10/21 1349       Check-In   Supervising physician immediately available to respond to emergencies See telemetry face sheet for immediately available ER MD    Location [facility]-Cardiac & Pulmonary Rehab    Staff Present Birdie Sons, MPA, Nino Glow, MS, ASCM CEP, Exercise Physiologist;Joseph Tessie Fass, [place]    Virtual Visit No    Medication changes reported     No    Fall or balance concerns reported    No    Warm-up and Cool-down Performed on first and last piece of equipment    Resistance Training Performed Yes    VAD Patient? No    PAD/SET Patient? No      Pain Assessment   Currently in Pain? No/denies                Social History   Tobacco Use  Smoking Status Former   Types: Cigarettes   Quit date: 2000   Years since quitting: 22.6  Smokeless Tobacco Never    Goals Met:  Independence with exercise equipment Exercise tolerated well Personal goals reviewed No report of cardiac concerns or symptoms Strength training completed today  Goals Unmet:  Not Applicable  Comments: Pt able to follow exercise prescription today without complaint.  Will continue to monitor for progression.    Dr. Emily Filbert is Medical Director for [place].  Dr. Ottie Glazier is Medical Director for [facility] Pulmonary Rehabilitation.

## 2021-05-11 ENCOUNTER — Other Ambulatory Visit: Payer: Self-pay

## 2021-05-11 ENCOUNTER — Encounter: Payer: Medicare Other | Admitting: *Deleted

## 2021-05-11 DIAGNOSIS — J449 Chronic obstructive pulmonary disease, unspecified: Secondary | ICD-10-CM | POA: Diagnosis not present

## 2021-05-11 NOTE — Progress Notes (Signed)
Daily Session Note  Patient Details  Name: Anna Mooney MRN: [id number] Date of Birth: [birth year] Referring Provider:   Flowsheet Row Pulmonary Rehab from 03/13/2021 in [facility] Cardiac and Pulmonary Rehab  Referring Provider Mosher       Encounter Date: 05/11/2021  Check In:  Session Check In - 05/11/21 1326       Check-In   Supervising physician immediately available to respond to emergencies See telemetry face sheet for immediately available ER MD    Location [facility]-Cardiac & Pulmonary Rehab    Staff Present Renita Papa, RN BSN;Joseph Ashdown, RCP,RRT,BSRT;Jessica Winfield, [place], RCEP, CCRP, CCET    Virtual Visit No    Medication changes reported     No    Fall or balance concerns reported    No    Warm-up and Cool-down Performed on first and last piece of equipment    Resistance Training Performed Yes    VAD Patient? No    PAD/SET Patient? No      Pain Assessment   Currently in Pain? No/denies                Social History   Tobacco Use  Smoking Status Former   Types: Cigarettes   Quit date: 2000   Years since quitting: 22.6  Smokeless Tobacco Never    Goals Met:  Independence with exercise equipment Exercise tolerated well No report of concerns or symptoms today Strength training completed today  Goals Unmet:  Not Applicable  Comments: Pt able to follow exercise prescription today without complaint.  Will continue to monitor for progression.    Dr. Emily Filbert is Medical Director for [place].  Dr. Ottie Glazier is Medical Director for [facility] Pulmonary Rehabilitation.

## 2021-05-15 ENCOUNTER — Other Ambulatory Visit: Payer: Self-pay

## 2021-05-15 DIAGNOSIS — J449 Chronic obstructive pulmonary disease, unspecified: Secondary | ICD-10-CM

## 2021-05-15 NOTE — Progress Notes (Signed)
Daily Session Note  Patient Details  Name: Anna Mooney MRN: [id number] Date of Birth: [birth year] Referring Provider:   Flowsheet Row Pulmonary Rehab from 03/13/2021 in [facility] Cardiac and Pulmonary Rehab  Referring Provider Mosher       Encounter Date: 05/15/2021  Check In:  Session Check In - 05/15/21 1353       Check-In   Supervising physician immediately available to respond to emergencies See telemetry face sheet for immediately available ER MD    Location [facility]-Cardiac & Pulmonary Rehab    Staff Present Birdie Sons, MPA, Nino Glow, MS, ASCM CEP, Exercise Physiologist;Joseph Tessie Fass, [place]    Virtual Visit No    Medication changes reported     No    Fall or balance concerns reported    No    Warm-up and Cool-down Performed on first and last piece of equipment    Resistance Training Performed Yes    VAD Patient? No    PAD/SET Patient? No      Pain Assessment   Currently in Pain? No/denies                Social History   Tobacco Use  Smoking Status Former   Types: Cigarettes   Quit date: 2000   Years since quitting: 22.6  Smokeless Tobacco Never    Goals Met:  Independence with exercise equipment Exercise tolerated well No report of concerns or symptoms today Strength training completed today  Goals Unmet:  Not Applicable  Comments: Pt able to follow exercise prescription today without complaint.  Will continue to monitor for progression.    Dr. Emily Filbert is Medical Director for [place].  Dr. Ottie Glazier is Medical Director for [facility] Pulmonary Rehabilitation.

## 2021-05-17 ENCOUNTER — Other Ambulatory Visit: Payer: Self-pay

## 2021-05-17 DIAGNOSIS — J449 Chronic obstructive pulmonary disease, unspecified: Secondary | ICD-10-CM

## 2021-05-17 NOTE — Progress Notes (Signed)
Daily Session Note  Patient Details  Name: Anna Mooney MRN: [id number] Date of Birth: 05-02-1946 Referring Provider:   Flowsheet Row Pulmonary Rehab from 03/13/2021 in [facility] Cardiac and Pulmonary Rehab  Referring Provider Mosher       Encounter Date: 05/17/2021  Check In:  Session Check In - 05/17/21 1330       Check-In   Supervising physician immediately available to respond to emergencies See telemetry face sheet for immediately available ER MD    Location [facility]-Cardiac & Pulmonary Rehab    Staff Present Birdie Sons, MPA, RN;Joseph Verdi, Sharren Bridge, MS, ASCM CEP, Exercise Physiologist;Jessica Luan Pulling, MA, RCEP, CCRP, CCET    Virtual Visit No    Medication changes reported     No    Fall or balance concerns reported    No    Warm-up and Cool-down Performed on first and last piece of equipment    Resistance Training Performed Yes    VAD Patient? No    PAD/SET Patient? No      Pain Assessment   Currently in Pain? No/denies                Social History   Tobacco Use  Smoking Status Former   Types: Cigarettes   Quit date: 2000   Years since quitting: 22.6  Smokeless Tobacco Never    Goals Met:  Independence with exercise equipment Exercise tolerated well No report of concerns or symptoms today Strength training completed today  Goals Unmet:  Not Applicable  Comments: Pt able to follow exercise prescription today without complaint.  Will continue to monitor for progression.    Dr. Emily Filbert is Medical Director for [place].  Dr. Ottie Glazier is Medical Director for [facility] Pulmonary Rehabilitation.

## 2021-05-18 ENCOUNTER — Other Ambulatory Visit: Payer: Self-pay

## 2021-05-18 ENCOUNTER — Encounter: Payer: Medicare Other | Attending: Internal Medicine | Admitting: *Deleted

## 2021-05-18 DIAGNOSIS — J449 Chronic obstructive pulmonary disease, unspecified: Secondary | ICD-10-CM | POA: Diagnosis not present

## 2021-05-18 NOTE — Progress Notes (Signed)
Daily Session Note  Patient Details  Name: Anna Mooney MRN: [id number] Date of Birth: Jul 16, 1946 Referring Provider:   Flowsheet Row Pulmonary Rehab from 03/13/2021 in [facility] Cardiac and Pulmonary Rehab  Referring Provider Mosher       Encounter Date: 05/18/2021  Check In:  Session Check In - 05/18/21 1328       Check-In   Supervising physician immediately available to respond to emergencies See telemetry face sheet for immediately available ER MD    Location [facility]-Cardiac & Pulmonary Rehab    Staff Present Renita Papa, RN BSN;Joseph Tessie Fass, RCP,RRT,BSRT;Melissa Fieldon, [place], LDN    Virtual Visit No    Medication changes reported     No    Fall or balance concerns reported    No    Warm-up and Cool-down Performed on first and last piece of equipment    Resistance Training Performed Yes    VAD Patient? No    PAD/SET Patient? No      Pain Assessment   Currently in Pain? No/denies                Social History   Tobacco Use  Smoking Status Former   Types: Cigarettes   Quit date: 2000   Years since quitting: 22.6  Smokeless Tobacco Never    Goals Met:  Independence with exercise equipment Exercise tolerated well No report of concerns or symptoms today Strength training completed today  Goals Unmet:  Not Applicable  Comments: Pt able to follow exercise prescription today without complaint.  Will continue to monitor for progression.'    Dr. Emily Filbert is Medical Director for [place].  Dr. Ottie Glazier is Medical Director for [facility] Pulmonary Rehabilitation.

## 2021-05-24 ENCOUNTER — Other Ambulatory Visit: Payer: Self-pay

## 2021-05-24 ENCOUNTER — Encounter: Payer: Self-pay | Admitting: *Deleted

## 2021-05-24 ENCOUNTER — Encounter: Payer: Medicare Other | Admitting: *Deleted

## 2021-05-24 DIAGNOSIS — J449 Chronic obstructive pulmonary disease, unspecified: Secondary | ICD-10-CM

## 2021-05-24 NOTE — Progress Notes (Signed)
Daily Session Note  Patient Details  Name: Anna Mooney MRN: [id number] Date of Birth: 09-09-1946 Referring Provider:   Flowsheet Row Pulmonary Rehab from 03/13/2021 in [facility] Cardiac and Pulmonary Rehab  Referring Provider Mosher       Encounter Date: 05/24/2021  Check In:  Session Check In - 05/24/21 1502       Check-In   Supervising physician immediately available to respond to emergencies See telemetry face sheet for immediately available ER MD    Location [facility]-Cardiac & Pulmonary Rehab    Staff Present Nyoka Cowden, RN, BSN, Willette Pa, MA, RCEP, CCRP, Marylynn Pearson, MS, ASCM CEP, Exercise Physiologist    Virtual Visit No    Fall or balance concerns reported    No    Tobacco Cessation No Change    Warm-up and Cool-down Performed on first and last piece of equipment    Resistance Training Performed No    VAD Patient? No    PAD/SET Patient? No      Pain Assessment   Currently in Pain? No/denies    Multiple Pain Sites No                Social History   Tobacco Use  Smoking Status Former   Types: Cigarettes   Quit date: 2000   Years since quitting: 22.6  Smokeless Tobacco Never    Goals Met:  Independence with exercise equipment Exercise tolerated well No report of concerns or symptoms today  Goals Unmet:  Not Applicable  Comments: Pt able to follow exercise prescription today without complaint.  Will continue to monitor for progression.    Dr. Emily Filbert is Medical Director for [place].  Dr. Ottie Glazier is Medical Director for [facility] Pulmonary Rehabilitation.

## 2021-05-24 NOTE — Progress Notes (Signed)
Pulmonary Individual Treatment Plan  Patient Details  Name: Anna Mooney MRN: [id number] Date of Birth: 04-09-1946 Referring Provider:   Flowsheet Row Pulmonary Rehab from 03/13/2021 in [facility] Cardiac and Pulmonary Rehab  Referring Provider Mosher       Initial Encounter Date:  Flowsheet Row Pulmonary Rehab from 03/13/2021 in [facility] Cardiac and Pulmonary Rehab  Date 03/13/21       Visit Diagnosis: Chronic obstructive pulmonary disease, unspecified COPD type ([place])  Patient's Home Medications on Admission:  Current Outpatient Medications:    albuterol (VENTOLIN HFA) 108 (90 Base) MCG/ACT inhaler, Inhale into the lungs., Disp: , Rfl:    Calcium Carbonate-Vitamin D 600-200 MG-UNIT TABS, Take 1 tablet by mouth daily., Disp: , Rfl:    ibuprofen (ADVIL) 200 MG tablet, Take by mouth., Disp: , Rfl:    ipratropium-albuterol (DUONEB) 0.5-2.5 (3) MG/3ML SOLN, Inhale into the lungs., Disp: , Rfl:    Melatonin 10 MG TABS, Take by mouth., Disp: , Rfl:    montelukast (SINGULAIR) 10 MG tablet, SMARTSIG:1 Tablet(s) By Mouth Every Evening, Disp: , Rfl:    TRELEGY ELLIPTA 100-62.5-25 MCG/INH AEPB, Inhale 1 puff into the lungs daily., Disp: , Rfl:   Past Medical History: No past medical history on file.  Tobacco Use: Social History   Tobacco Use  Smoking Status Former   Types: Cigarettes   Quit date: 2000   Years since quitting: 22.6  Smokeless Tobacco Never    Labs: Recent Review Flowsheet Data   There is no flowsheet data to display.      Pulmonary Assessment Scores:  Pulmonary Assessment Scores     Row Name 03/09/21 1513 03/13/21 1403       ADL UCSD   SOB Score total 80 --    Rest 1 --    Walk 4 --    Stairs 5 --    Bath 4 --    Dress 3 --    Shop 4 --         CAT Score   CAT Score -- 18         mMRC Score   mMRC Score -- 3             UCSD: Self-administered rating of dyspnea associated with activities of daily living (ADLs) 6-point scale (0 = "not at all" to  5 = "maximal or unable to do because of breathlessness")  Scoring Scores range from 0 to 120.  Minimally important difference is 5 units  CAT: CAT can identify the health impairment of COPD patients and is better correlated with disease progression.  CAT has a scoring range of zero to 40. The CAT score is classified into four groups of low (less than 10), medium (10 - 20), high (21-30) and very high (31-40) based on the impact level of disease on health status. A CAT score over 10 suggests significant symptoms.  A worsening CAT score could be explained by an exacerbation, poor medication adherence, poor inhaler technique, or progression of COPD or comorbid conditions.  CAT MCID is 2 points  mMRC: mMRC (Modified Medical Research Council) Dyspnea Scale is used to assess the degree of baseline functional disability in patients of respiratory disease due to dyspnea. No minimal important difference is established. A decrease in score of 1 point or greater is considered a positive change.   Pulmonary Function Assessment:   Exercise Target Goals: Exercise Program Goal: Individual exercise prescription set using results from initial 6 min walk test and THRR while considering  patient's activity barriers and safety.   Exercise Prescription Goal: Initial exercise prescription builds to 30-45 minutes a day of aerobic activity, 2-3 days per week.  Home exercise guidelines will be given to patient during program as part of exercise prescription that the participant will acknowledge.  Education: Aerobic Exercise: - Group verbal and visual presentation on the components of exercise prescription. Introduces F.I.T.T principle from ACSM for exercise prescriptions.  Reviews F.I.T.T. principles of aerobic exercise including progression. Written material given at graduation. Flowsheet Row Pulmonary Rehab from 05/17/2021 in [facility] Cardiac and Pulmonary Rehab  Date 05/10/21  Educator AS  Instruction Review Code 1-  Verbalizes Understanding       Education: Resistance Exercise: - Group verbal and visual presentation on the components of exercise prescription. Introduces F.I.T.T principle from ACSM for exercise prescriptions  Reviews F.I.T.T. principles of resistance exercise including progression. Written material given at graduation. Flowsheet Row Pulmonary Rehab from 05/17/2021 in [facility] Cardiac and Pulmonary Rehab  Date 03/15/21  Educator [facility]  Instruction Review Code 1- Verbalizes Understanding        Education: Exercise & Equipment Safety: - Individual verbal instruction and demonstration of equipment use and safety with use of the equipment. Flowsheet Row Pulmonary Rehab from 05/17/2021 in [facility] Cardiac and Pulmonary Rehab  Date 03/09/21  Educator AS  Instruction Review Code 1- Verbalizes Understanding       Education: Exercise Physiology & General Exercise Guidelines: - Group verbal and written instruction with models to review the exercise physiology of the cardiovascular system and associated critical values. Provides general exercise guidelines with specific guidelines to those with heart or lung disease.  Flowsheet Row Pulmonary Rehab from 05/17/2021 in [facility] Cardiac and Pulmonary Rehab  Date 05/03/21  Educator AS  Instruction Review Code 1- Verbalizes Understanding       Education: Flexibility, Balance, Mind/Body Relaxation: - Group verbal and visual presentation with interactive activity on the components of exercise prescription. Introduces F.I.T.T principle from ACSM for exercise prescriptions. Reviews F.I.T.T. principles of flexibility and balance exercise training including progression. Also discusses the mind body connection.  Reviews various relaxation techniques to help reduce and manage stress (i.e. Deep breathing, progressive muscle relaxation, and visualization). Balance handout provided to take home. Written material given at graduation. Flowsheet Row Pulmonary Rehab from  05/17/2021 in [facility] Cardiac and Pulmonary Rehab  Date 03/22/21  Educator [facility]  Instruction Review Code 1- Verbalizes Understanding       Activity Barriers & Risk Stratification:  Activity Barriers & Cardiac Risk Stratification - 03/03/21 1307       Activity Barriers & Cardiac Risk Stratification   Activity Barriers None             6 Minute Walk:  6 Minute Walk     Row Name 03/13/21 1403         6 Minute Walk   Phase Initial     Distance 500 feet     Walk Time 4.5 minutes     # of Rest Breaks 2     MPH 1.3     METS 2     RPE 17     Perceived Dyspnea  3     VO2 Peak 7.3     Symptoms Yes (comment)     Comments SOB     Resting HR 121 bpm     Resting BP 140/78     Resting Oxygen Saturation  96 %     Exercise Oxygen Saturation  during 6 min walk  92 %     Max Ex. HR 132 bpm     Max Ex. BP 154/84     2 Minute Post BP 118/80           Interval HR   1 Minute HR 121     2 Minute HR 129     3 Minute HR 132     4 Minute HR 127     5 Minute HR 124     6 Minute HR 126     2 Minute Post HR 121     Interval Heart Rate? Yes           Interval Oxygen   Interval Oxygen? Yes     Baseline Oxygen Saturation % 96 %     1 Minute Oxygen Saturation % 95 %     1 Minute Liters of Oxygen 2 L     2 Minute Oxygen Saturation % 93 %     2 Minute Liters of Oxygen 2 L     3 Minute Oxygen Saturation % 92 %     3 Minute Liters of Oxygen 2 L     4 Minute Oxygen Saturation % 94 %     4 Minute Liters of Oxygen 2 L     5 Minute Oxygen Saturation % 93 %     5 Minute Liters of Oxygen 2 L     6 Minute Oxygen Saturation % 92 %     6 Minute Liters of Oxygen 2 L     2 Minute Post Oxygen Saturation % 96 %     2 Minute Post Liters of Oxygen 2 L             Oxygen Initial Assessment:  Oxygen Initial Assessment - 03/03/21 1305       Home Oxygen   Home Oxygen Device Home Concentrator;Portable Concentrator    Sleep Oxygen Prescription Continuous    Liters per minute 2    Home  Exercise Oxygen Prescription Continuous    Liters per minute 2    Home Resting Oxygen Prescription Continuous    Liters per minute 2    Compliance with Home Oxygen Use Yes      Intervention   Short Term Goals To learn and exhibit compliance with exercise, home and travel O2 prescription;To learn and understand importance of monitoring SPO2 with pulse oximeter and demonstrate accurate use of the pulse oximeter.;To learn and understand importance of maintaining oxygen saturations>88%;To learn and demonstrate proper pursed lip breathing techniques or other breathing techniques. ;To learn and demonstrate proper use of respiratory medications    Long  Term Goals Exhibits compliance with exercise, home  and travel O2 prescription;Verbalizes importance of monitoring SPO2 with pulse oximeter and return demonstration;Maintenance of O2 saturations>88%;Exhibits proper breathing techniques, such as pursed lip breathing or other method taught during program session;Compliance with respiratory medication;Demonstrates proper use of MDI's             Oxygen Re-Evaluation:  Oxygen Re-Evaluation     Row Name 03/13/21 1419 04/06/21 1347 05/11/21 1356         Program Oxygen Prescription   Program Oxygen Prescription -- Continuous Continuous     Liters per minute -- 2 2           Home Oxygen   Home Oxygen Device Home Concentrator;Portable Concentrator Home Concentrator;Portable Concentrator Home Concentrator;Portable Concentrator     Sleep Oxygen Prescription Continuous Continuous Continuous     Liters per  minute _0 Home Exercise Oxygen Prescription Continuous Continuous Continuous     Liters per minute _1 Home Resting Oxygen Prescription Continuous Continuous Continuous     Liters per minute _2 Compliance with Home Oxygen Use Yes Yes Yes           Goals/Expected Outcomes   Short Term Goals To learn and exhibit compliance with exercise, home and travel O2 prescription;To learn  and understand importance of monitoring SPO2 with pulse oximeter and demonstrate accurate use of the pulse oximeter.;To learn and understand importance of maintaining oxygen saturations>88%;To learn and demonstrate proper pursed lip breathing techniques or other breathing techniques.  -- To learn and exhibit compliance with exercise, home and travel O2 prescription;To learn and understand importance of monitoring SPO2 with pulse oximeter and demonstrate accurate use of the pulse oximeter.;To learn and understand importance of maintaining oxygen saturations>88%;To learn and demonstrate proper pursed lip breathing techniques or other breathing techniques.      Long  Term Goals Exhibits compliance with exercise, home  and travel O2 prescription;Verbalizes importance of monitoring SPO2 with pulse oximeter and return demonstration;Maintenance of O2 saturations>88%;Exhibits proper breathing techniques, such as pursed lip breathing or other method taught during program session -- Exhibits compliance with exercise, home  and travel O2 prescription;Verbalizes importance of monitoring SPO2 with pulse oximeter and return demonstration;Maintenance of O2 saturations>88%;Exhibits proper breathing techniques, such as pursed lip breathing or other method taught during program session;Compliance with respiratory medication;Demonstrates proper use of MDI's     Comments Reviewed PLB technique with pt.  Talked about how it works and it's importance in maintaining their exercise saturations. Anna Mooney feels she sees improvements in breathing some days but not consistently. Anna Mooney is doing well in rehab.  Her breathing still gives her trouble, but she has noticed that it is getting better. She is able to do more now than she used to.  She is doing well on her oxygen and using her inhalers.  She likes her PLB when she gets short of breath.     Goals/Expected Outcomes Short: Become more profiecient at using PLB.   Long: Become independent at  using PLB. Short: continue to work on PLB Long: usegood PLB technique as needed Short: Continue to use PLB Long: Continue to improve breathing.              Oxygen Discharge (Final Oxygen Re-Evaluation):  Oxygen Re-Evaluation - 05/11/21 1356       Program Oxygen Prescription   Program Oxygen Prescription Continuous    Liters per minute 2      Home Oxygen   Home Oxygen Device Home Concentrator;Portable Concentrator    Sleep Oxygen Prescription Continuous    Liters per minute 2    Home Exercise Oxygen Prescription Continuous    Liters per minute 2    Home Resting Oxygen Prescription Continuous    Liters per minute 2    Compliance with Home Oxygen Use Yes      Goals/Expected Outcomes   Short Term Goals To learn and exhibit compliance with exercise, home and travel O2 prescription;To learn and understand importance of monitoring SPO2 with pulse oximeter and demonstrate accurate use of the pulse oximeter.;To learn and understand importance of maintaining oxygen saturations>88%;To learn and demonstrate proper pursed lip breathing techniques or other breathing techniques.     Long  Term Goals Exhibits compliance with exercise, home  and travel O2  prescription;Verbalizes importance of monitoring SPO2 with pulse oximeter and return demonstration;Maintenance of O2 saturations>88%;Exhibits proper breathing techniques, such as pursed lip breathing or other method taught during program session;Compliance with respiratory medication;Demonstrates proper use of MDI's    Comments Anna Mooney is doing well in rehab.  Her breathing still gives her trouble, but she has noticed that it is getting better. She is able to do more now than she used to.  She is doing well on her oxygen and using her inhalers.  She likes her PLB when she gets short of breath.    Goals/Expected Outcomes Short: Continue to use PLB Long: Continue to improve breathing.             Initial Exercise Prescription:  Initial Exercise  Prescription - 03/13/21 1400       Date of Initial Exercise RX and Referring Provider   Date 03/13/21    Referring Provider Mosher      Oxygen   Oxygen Continuous    Liters 2      Treadmill   MPH 1.3    Grade 0    Minutes 15   rest as needed   METs 2      Recumbant Bike   Level 1    RPM 60    Minutes 15    METs 2      NuStep   Level 1    SPM 80    Minutes 15    METs 2      T5 Nustep   Level 1    SPM 80    Minutes 15    METs 2      Biostep-RELP   Level 1    SPM 50    Minutes 15    METs 2      Prescription Details   Frequency (times per week) 3    Duration Progress to 30 minutes of continuous aerobic without signs/symptoms of physical distress      Intensity   THRR 40-80% of Max Heartrate 131-141    Ratings of Perceived Exertion 11-13    Perceived Dyspnea 0-4      Resistance Training   Training Prescription Yes    Weight 3 lb    Reps 10-15             Perform Capillary Blood Glucose checks as needed.  Exercise Prescription Changes:   Exercise Prescription Changes     Row Name 03/13/21 1400 03/27/21 1000 04/10/21 1500 04/25/21 1200 05/11/21 1100     Response to Exercise   Blood Pressure (Admit) 140/78 150/84 120/64 140/76 112/60   Blood Pressure (Exercise) 154/84 142/86 -- -- --   Blood Pressure (Exit) 118/80 100/60 120/70 122/70 112/60   Heart Rate (Admit) 121 bpm 102 bpm 105 bpm 109 bpm 90 bpm   Heart Rate (Exercise) 132 bpm 109 bpm 113 bpm 113 bpm 107 bpm   Heart Rate (Exit) 121 bpm 99 bpm 111 bpm 98 bpm 97 bpm   Oxygen Saturation (Admit) 96 % 95 % 97 % 96 % 98 %   Oxygen Saturation (Exercise) 92 % 93 % 97 % 96 % 99 %   Oxygen Saturation (Exit) 96 % 99 % 97 % 98 % 97 %   Rating of Perceived Exertion (Exercise) _0 Perceived Dyspnea (Exercise) _1 Symptoms SOB SOB SOB SOB,fatigue SOB   Comments -- first full day of exercise -- -- --  Duration -- Progress to 30 minutes of  aerobic without signs/symptoms of physical  distress Continue with 30 min of aerobic exercise without signs/symptoms of physical distress. Continue with 30 min of aerobic exercise without signs/symptoms of physical distress. Continue with 30 min of aerobic exercise without signs/symptoms of physical distress.   Intensity -- THRR unchanged THRR unchanged THRR unchanged THRR unchanged     Progression   Progression -- Continue to progress workloads to maintain intensity without signs/symptoms of physical distress. Continue to progress workloads to maintain intensity without signs/symptoms of physical distress. Continue to progress workloads to maintain intensity without signs/symptoms of physical distress. Continue to progress workloads to maintain intensity without signs/symptoms of physical distress.   Average METs -- 1.77 1.93 1.8 1.96     Resistance Training   Training Prescription -- Yes Yes Yes Yes   Weight -- 3 lb 3 lb 3 lb 3 lb   Reps -- 10-15 10-15 10-15 10-15     Interval Training   Interval Training -- No No No No     Oxygen   Oxygen -- Continuous Continuous Continuous Continuous   Liters -- _0 Treadmill   MPH -- 1.3 1.3 -- 1.2   Grade -- 0 0 -- 1   Minutes -- 15 15 -- 15   METs -- 2 2 -- 2.08     NuStep   Level -- _1 Minutes -- _2 METs -- 1.8 2 1.8 1.6     Arm Ergometer   Level -- -- -- -- 1   Minutes -- -- -- -- 15   METs -- -- -- -- 2     T5 Nustep   Level -- -- 1 -- 1   Minutes -- -- 15 -- 15   METs -- -- -- -- 1.6     Biostep-RELP   Level -- -- 1 -- --   Minutes -- -- 15 -- --   METs -- -- 2 -- --     Home Exercise Plan   Plans to continue exercise at -- -- Home (comment)  walking, treadmill Home (comment)  walking, treadmill Home (comment)  walking, treadmill   Frequency -- -- Add 2 additional days to program exercise sessions. Add 2 additional days to program exercise sessions. Add 2 additional days to program exercise sessions.   Initial Home Exercises Provided -- --  04/06/21 04/06/21 04/06/21            Exercise Comments:   Exercise Comments     Row Name 03/13/21 1418           Exercise Comments First full day of exercise!  Patient was oriented to gym and equipment including functions, settings, policies, and procedures.  Patient's individual exercise prescription and treatment plan were reviewed.  All starting workloads were established based on the results of the 6 minute walk test done at initial orientation visit.  The plan for exercise progression was also introduced and progression will be customized based on patient's performance and goals.                Exercise Goals and Review:   Exercise Goals     Row Name 03/13/21 1409             Exercise Goals   Increase Physical Activity Yes       Intervention Provide advice, education, support and counseling about physical  activity/exercise needs.;Develop an individualized exercise prescription for aerobic and resistive training based on initial evaluation findings, risk stratification, comorbidities and participant's personal goals.       Expected Outcomes Short Term: Attend rehab on a regular basis to increase amount of physical activity.;Long Term: Add in home exercise to make exercise part of routine and to increase amount of physical activity.;Long Term: Exercising regularly at least 3-5 days a week.       Increase Strength and Stamina Yes       Intervention Provide advice, education, support and counseling about physical activity/exercise needs.;Develop an individualized exercise prescription for aerobic and resistive training based on initial evaluation findings, risk stratification, comorbidities and participant's personal goals.       Expected Outcomes Short Term: Increase workloads from initial exercise prescription for resistance, speed, and METs.;Short Term: Perform resistance training exercises routinely during rehab and add in resistance training at home;Long Term: Improve  cardiorespiratory fitness, muscular endurance and strength as measured by increased METs and functional capacity (6MWT)       Able to understand and use rate of perceived exertion (RPE) scale Yes       Intervention Provide education and explanation on how to use RPE scale       Expected Outcomes Short Term: Able to use RPE daily in rehab to express subjective intensity level;Long Term:  Able to use RPE to guide intensity level when exercising independently       Able to understand and use Dyspnea scale Yes       Intervention Provide education and explanation on how to use Dyspnea scale       Expected Outcomes Short Term: Able to use Dyspnea scale daily in rehab to express subjective sense of shortness of breath during exertion;Long Term: Able to use Dyspnea scale to guide intensity level when exercising independently       Knowledge and understanding of Target Heart Rate Range (THRR) Yes       Intervention Provide education and explanation of THRR including how the numbers were predicted and where they are located for reference       Expected Outcomes Short Term: Able to state/look up THRR;Long Term: Able to use THRR to govern intensity when exercising independently;Short Term: Able to use daily as guideline for intensity in rehab       Able to check pulse independently Yes       Intervention Provide education and demonstration on how to check pulse in carotid and radial arteries.;Review the importance of being able to check your own pulse for safety during independent exercise       Expected Outcomes Short Term: Able to explain why pulse checking is important during independent exercise       Understanding of Exercise Prescription Yes       Intervention Provide education, explanation, and written materials on patient's individual exercise prescription       Expected Outcomes Short Term: Able to explain program exercise prescription;Long Term: Able to explain home exercise prescription to exercise  independently       Improve claudication pain toleration; Improve walking ability Yes       Intervention Participate in PAD/SET Rehab 2-3 days a week, walking at home as part of exercise prescription;Attend education sessions to aid in risk factor modification and understanding of disease process       Expected Outcomes Short Term: Improve walking distance/time to onset of claudication pain;Long Term: Improve score of PAD questionnaires  Exercise Goals Re-Evaluation :  Exercise Goals Re-Evaluation     Row Name 03/13/21 1418 03/27/21 1012 04/06/21 1402 04/10/21 1524 04/25/21 1235     Exercise Goal Re-Evaluation   Exercise Goals Review Increase Physical Activity;Knowledge and understanding of Target Heart Rate Range (THRR);Able to understand and use rate of perceived exertion (RPE) scale;Understanding of Exercise Prescription;Increase Strength and Stamina;Able to understand and use Dyspnea scale;Able to check pulse independently Increase Physical Activity;Increase Strength and Stamina Increase Physical Activity;Increase Strength and Stamina Increase Physical Activity;Increase Strength and Stamina;Understanding of Exercise Prescription Increase Physical Activity;Increase Strength and Stamina   Comments Reviewed RPE and dyspnea scales, THR and program prescription with pt today.  Pt voiced understanding and was given a copy of goals to take home. Anna Mooney is doing well her first couple of weeks of being at rehab. She has already increased to level 3 on the T4 NuStep. She is building up tolerance on the treadmill and oxygen saturations are staying above 88% during exercise. Will continue to monitor. Reviewed home exercise with pt today.  Pt plans to walk TM and use bike for exercise.  Reviewed THR, pulse, RPE, sign and symptoms, pulse oximetery and when to call 911 or MD.  Also discussed weather considerations and indoor options.  Pt voiced understanding. Anna Mooney is doing well in rehab.  She is  enjoying the treadmill and up to the full 15 minutes on there.  We will continue to monitor her progress. Anna Mooney has somedays that are harder breathing and does only seated equipment.  Oxygen stays in the 90s.  Staff will monitor progress.   Expected Outcomes Short: Use RPE daily to regulate intensity. Long: Follow program prescription in THR. Short: Continue to increase speed on treadmill Long: Continue to increase overall strength and stamina Short: monitor HR an dO2 while exercising at home Long: continue to build stamina Short: Make sure to put in weight and maintain spm on steppers Long: continue to improve stamia Short:  continue to wok on TM Long:  build stamina and improve SOB    Row Name 05/11/21 1137 05/11/21 1345           Exercise Goal Re-Evaluation   Exercise Goals Review Increase Physical Activity;Increase Strength and Stamina Increase Physical Activity;Increase Strength and Stamina;Understanding of Exercise Prescription      Comments Anna Mooney has added incline to her treadmill, up to 1%! She should continue to build up her speed as tolerated. Her oxygen saturation is maintaining in stable levels Anna Mooney is doing well in rehab.  She feels that she does better at home without the mask versus in class with it.  She will usually use the treadmill for 20-30 min. She is feeling stronger.      Expected Outcomes Short: Continue to build up workloads on treadmill as tolerated Long: Continue to increase overall MET level Short: Continue to exercise on off days Long: Continue to improve stamina.               Discharge Exercise Prescription (Final Exercise Prescription Changes):  Exercise Prescription Changes - 05/11/21 1100       Response to Exercise   Blood Pressure (Admit) 112/60    Blood Pressure (Exit) 112/60    Heart Rate (Admit) 90 bpm    Heart Rate (Exercise) 107 bpm    Heart Rate (Exit) 97 bpm    Oxygen Saturation (Admit) 98 %    Oxygen Saturation (Exercise) 99 %    Oxygen  Saturation (Exit) 97 %  Rating of Perceived Exertion (Exercise) 13    Perceived Dyspnea (Exercise) 3    Symptoms SOB    Duration Continue with 30 min of aerobic exercise without signs/symptoms of physical distress.    Intensity THRR unchanged      Progression   Progression Continue to progress workloads to maintain intensity without signs/symptoms of physical distress.    Average METs 1.96      Resistance Training   Training Prescription Yes    Weight 3 lb    Reps 10-15      Interval Training   Interval Training No      Oxygen   Oxygen Continuous    Liters 2      Treadmill   MPH 1.2    Grade 1    Minutes 15    METs 2.08      NuStep   Level 3    Minutes 15    METs 1.6      Arm Ergometer   Level 1    Minutes 15    METs 2      T5 Nustep   Level 1    Minutes 15    METs 1.6      Home Exercise Plan   Plans to continue exercise at Home (comment)   walking, treadmill   Frequency Add 2 additional days to program exercise sessions.    Initial Home Exercises Provided 04/06/21             Nutrition:  Target Goals: Understanding of nutrition guidelines, daily intake of sodium <1563m, cholesterol <2040m calories 30% from fat and 7% or less from saturated fats, daily to have 5 or more servings of fruits and vegetables.  Education: All About Nutrition: -Group instruction provided by verbal, written material, interactive activities, discussions, models, and posters to present general guidelines for heart healthy nutrition including fat, fiber, MyPlate, the role of sodium in heart healthy nutrition, utilization of the nutrition label, and utilization of this knowledge for meal planning. Follow up email sent as well. Written material given at graduation. [facility]  Date 03/29/21  [facility] 1- Verbalizes Understanding       Biometrics:  Pre Biometrics - 03/13/21 1410        Pre Biometrics   Height _0  (1.676 m)    Weight 139 lb 14.4 oz (63.5 kg)    BMI (Calculated) 22.59    Single Leg Stand 6.34 seconds              Nutrition Therapy Plan and Nutrition Goals:  Nutrition Therapy & Goals - 04/03/21 1511       Nutrition Therapy   Diet Heart healthy, low Na, pulmonary friendly    Protein (specify units) 75g    Fiber 25 grams    Whole Grain Foods 3 servings    Saturated Fats 12 max. grams    Fruits and Vegetables 8 servings/day    Sodium 1.5 grams      Personal Nutrition Goals   Nutrition Goal ST: add in protein snack like apple with peanut butter, pt is on the fence regarding protein shake, wants to try out prepared meals LT: limit Na < 1.5g/day, eat enough protein    Comments Anna Mooney reports having a poor appetite and will make herself eat. She used to salt everything she ate, but no longer does this. She continues to go out to eat 2x/day breakfast  and lunch. Anna Mooney is her favorite food- for breakfast yesterday she had bacon and egg and an apple and for dinner a salad - she threw out the fried chicken. She enjoys beans and does not care for meat much anymore. She reports not wanting to prepare meals and would be interested in a meal kit. Discussed some options she could try and the pros/cons of doing so. Discussed heart healthy and pulmonary friendly eating.      Intervention Plan   Intervention Prescribe, educate and counsel regarding individualized specific dietary modifications aiming towards targeted core components such as weight, hypertension, lipid management, diabetes, heart failure and other comorbidities.;Nutrition handout(s) given to patient.    Expected Outcomes Short Term Goal: Understand basic principles of dietary content, such as calories, fat, sodium, cholesterol and nutrients.;Short Term Goal: A plan has been developed with personal nutrition goals set during dietitian appointment.;Long Term Goal: Adherence to prescribed nutrition  plan.             Nutrition Assessments:  MEDIFICTS Score Key: ?70 Need to make dietary changes  40-70 Heart Healthy Diet ? 40 Therapeutic Level Cholesterol Diet  Flowsheet Row Pulmonary Rehab from 03/09/2021 in [facility] Cardiac and Pulmonary Rehab  Picture Your Plate Total Score on Admission 45      Picture Your Plate Scores: <09 Unhealthy dietary pattern with much room for improvement. 41-50 Dietary pattern unlikely to meet recommendations for good health and room for improvement. 51-60 More healthful dietary pattern, with some room for improvement.  >60 Healthy dietary pattern, although there may be some specific behaviors that could be improved.   Nutrition Goals Re-Evaluation:  Nutrition Goals Re-Evaluation     [place] Name 05/11/21 1350             Goals   Nutrition Goal ST: add in protein snack like apple with peanut butter, pt is on the fence regarding protein shake, wants to try out prepared meals LT: limit Na < 1.5g/day, eat enough protein       Comment Anna Mooney is doing well in rehab. Her diet is going okay.  She still is lacking an appetite and has tried the protein drinks and bars.  She just has to remember to drink/eat them versus fast food.  She is trying to watch her sodium intake more, but she still loves her salt.       Expected Outcome Short: Aim for more protein Long: Continue to watch salt                Nutrition Goals Discharge (Final Nutrition Goals Re-Evaluation):  Nutrition Goals Re-Evaluation - 05/11/21 1350       Goals   Nutrition Goal ST: add in protein snack like apple with peanut butter, pt is on the fence regarding protein shake, wants to try out prepared meals LT: limit Na < 1.5g/day, eat enough protein    Comment Anna Mooney is doing well in rehab. Her diet is going okay.  She still is lacking an appetite and has tried the protein drinks and bars.  She just has to remember to drink/eat them versus fast food.  She is trying to watch her sodium intake  more, but she still loves her salt.    Expected Outcome Short: Aim for more protein Long: Continue to watch salt             Psychosocial: Target Goals: Acknowledge presence or absence of significant depression and/or stress, maximize coping skills, provide positive support system. Participant is able  to verbalize types and ability to use techniques and skills needed for reducing stress and depression.   Education: Stress, Anxiety, and Depression - Group verbal and visual presentation to define topics covered.  Reviews how body is impacted by stress, anxiety, and depression.  Also discusses healthy ways to reduce stress and to treat/manage anxiety and depression.  Written material given at graduation. Flowsheet Row Pulmonary Rehab from 05/17/2021 in [facility] Cardiac and Pulmonary Rehab  Date 04/26/21  Educator AS  Instruction Review Code 1- Verbalizes Understanding       Education: Sleep Hygiene -Provides group verbal and written instruction about how sleep can affect your health.  Define sleep hygiene, discuss sleep cycles and impact of sleep habits. Review good sleep hygiene tips.    Initial Review & Psychosocial Screening:  Initial Psych Review & Screening - 03/03/21 1312       Initial Review   Current issues with Current Stress Concerns;Current Sleep Concerns    Source of Stress Concerns Unable to participate in former interests or hobbies;Unable to perform yard/household activities      [place]? Yes   nieces, nephews, brother     Barriers   Psychosocial barriers to participate in program There are no identifiable barriers or psychosocial needs.      Screening Interventions   Interventions Encouraged to exercise;Provide feedback about the scores to participant;To provide support and resources with identified psychosocial needs    Expected Outcomes Short Term goal: Utilizing psychosocial counselor, staff and physician to assist with identification  of specific Stressors or current issues interfering with healing process. Setting desired goal for each stressor or current issue identified.;Long Term Goal: Stressors or current issues are controlled or eliminated.;Short Term goal: Identification and review with participant of any Quality of Life or Depression concerns found by scoring the questionnaire.;Long Term goal: The participant improves quality of Life and PHQ9 Scores as seen by post scores and/or verbalization of changes             Quality of Life Scores:  Scores of 19 and below usually indicate a poorer quality of life in these areas.  A difference of  2-3 points is a clinically meaningful difference.  A difference of 2-3 points in the total score of the Quality of Life Index has been associated with significant improvement in overall quality of life, self-image, physical symptoms, and general health in studies assessing change in quality of life.  PHQ-9: Recent Review Flowsheet Data     Depression screen [facility] 2/9 04/03/2021 03/09/2021   Decreased Interest 0 1   Down, Depressed, Hopeless 0 0   PHQ - 2 Score 0 1   Altered sleeping 1 3   Tired, decreased energy 1 3   Change in appetite 1 2   Feeling bad or failure about yourself  0 0   Trouble concentrating 0 0   Moving slowly or fidgety/restless 0 0   Suicidal thoughts 0 0   PHQ-9 Score 3 9   Difficult doing work/chores Not difficult at all -      Interpretation of Total Score  Total Score Depression Severity:  1-4 = Minimal depression, 5-9 = Mild depression, 10-14 = Moderate depression, 15-19 = Moderately severe depression, 20-27 = Severe depression   Psychosocial Evaluation and Intervention:  Psychosocial Evaluation - 03/03/21 1319       Psychosocial Evaluation & Interventions   Interventions Encouraged to exercise with the program and follow exercise prescription  Comments Anna Mooney is coming to pulmonary rehab for COPD in hopes this will help her breathing. She  lives alone, with nieces, nephews and a brother close by, and wishes to maintain her independence as long as possible. She started wearing oxygen in March and is compliant with it. Her appetite is a struggle in that when she finally is hungry and makes food she only eats a little of it. She states she feels like her stress has gone down now that one of her brothers is no longer suffering with dementia- which was very difficult to watch for her. She does wish she could do more than just sit at home and is hopeful this program will change that.    Expected Outcomes Short: attend pulmonary rehab for education and exercise. Long: develop positive health care habits.             Psychosocial Re-Evaluation:  Psychosocial Re-Evaluation     [place] Name 04/03/21 1417 05/11/21 1348           Psychosocial Re-Evaluation   Current issues with -- Current Sleep Concerns;Current Stress Concerns      Comments Reviewed patient health questionnaire (PHQ-9) with patient for follow up. Previously, patients score indicated signs/symptoms of depression.  Reviewed to see if patient is improving symptom wise while in program.  Score improved/declined and patient states that it is because she has been able to exercise more and has more energy. Anna Mooney is doing well in rehab.  She is feeling good mentally.  She did tried to talk to her doctor about her sleep and they put her on something that did not work for her.  She is between physcians as hers left recently.  She really wants to try something else to help.      Expected Outcomes Short: Continue to attend [facility] regularly for regular exercise and social engagement. Long: Continue to improve symptoms and manage a positive mental state. Short: Try to find new primary care physcian Long: Continue to stay positive.      Interventions Encouraged to attend Pulmonary Rehabilitation for the exercise Encouraged to attend Pulmonary Rehabilitation for the exercise      Continue  Psychosocial Services  No Follow up required --               Psychosocial Discharge (Final Psychosocial Re-Evaluation):  Psychosocial Re-Evaluation - 05/11/21 1348       Psychosocial Re-Evaluation   Current issues with Current Sleep Concerns;Current Stress Concerns    Comments Anna Mooney is doing well in rehab.  She is feeling good mentally.  She did tried to talk to her doctor about her sleep and they put her on something that did not work for her.  She is between physcians as hers left recently.  She really wants to try something else to help.    Expected Outcomes Short: Try to find new primary care physcian Long: Continue to stay positive.    Interventions Encouraged to attend Pulmonary Rehabilitation for the exercise             Education: Education Goals: Education classes will be provided on a weekly basis, covering required topics. Participant will state understanding/return demonstration of topics presented.  Learning Barriers/Preferences:  Learning Barriers/Preferences - 03/03/21 1314       Learning Barriers/Preferences   Learning Barriers None    Learning Preferences None             General Pulmonary Education Topics:  Infection Prevention: - Provides verbal  and written material to individual with discussion of infection control including proper hand washing and proper equipment cleaning during exercise session. Flowsheet Row Pulmonary Rehab from 05/17/2021 in [facility] Cardiac and Pulmonary Rehab  Date 03/09/21  Educator AS  Instruction Review Code 1- Verbalizes Understanding       Falls Prevention: - Provides verbal and written material to individual with discussion of falls prevention and safety.   Chronic Lung Disease Review: - Group verbal instruction with posters, models, PowerPoint presentations and videos,  to review new updates, new respiratory medications, new advancements in procedures and treatments. Providing information on websites and "800"  numbers for continued self-education. Includes information about supplement oxygen, available portable oxygen systems, continuous and intermittent flow rates, oxygen safety, concentrators, and Medicare reimbursement for oxygen. Explanation of Pulmonary Drugs, including class, frequency, complications, importance of spacers, rinsing mouth after steroid MDI's, and proper cleaning methods for nebulizers. Review of basic lung anatomy and physiology related to function, structure, and complications of lung disease. Review of risk factors. Discussion about methods for diagnosing sleep apnea and types of masks and machines for OSA. Includes a review of the use of types of environmental controls: home humidity, furnaces, filters, dust mite/pet prevention, HEPA vacuums. Discussion about weather changes, air quality and the benefits of nasal washing. Instruction on Warning signs, infection symptoms, calling MD promptly, preventive modes, and value of vaccinations. Review of effective airway clearance, coughing and/or vibration techniques. Emphasizing that all should Create an Action Plan. Written material given at graduation. Flowsheet Row Pulmonary Rehab from 05/17/2021 in [facility] Cardiac and Pulmonary Rehab  Date 04/19/21  Educator [facility]  Instruction Review Code 1- Verbalizes Understanding       AED/CPR: - Group verbal and written instruction with the use of models to demonstrate the basic use of the AED with the basic ABC's of resuscitation.    Anatomy and Cardiac Procedures: - Group verbal and visual presentation and models provide information about basic cardiac anatomy and function. Reviews the testing methods done to diagnose heart disease and the outcomes of the test results. Describes the treatment choices: Medical Management, Angioplasty, or Coronary Bypass Surgery for treating various heart conditions including Myocardial Infarction, Angina, Valve Disease, and Cardiac Arrhythmias.  Written material given at  graduation. Flowsheet Row Pulmonary Rehab from 05/17/2021 in [facility] Cardiac and Pulmonary Rehab  Date 03/15/21  Educator [facility]  Instruction Review Code 1- Verbalizes Understanding       Medication Safety: - Group verbal and visual instruction to review commonly prescribed medications for heart and lung disease. Reviews the medication, class of the drug, and side effects. Includes the steps to properly store meds and maintain the prescription regimen.  Written material given at graduation.   Other: -Provides group and verbal instruction on various topics (see comments)   Knowledge Questionnaire Score:    Core Components/Risk Factors/Patient Goals at Admission:  Personal Goals and Risk Factors at Admission - 03/13/21 1405       Core Components/Risk Factors/Patient Goals on Admission   Improve shortness of breath with ADL's Yes    Intervention Provide education, individualized exercise plan and daily activity instruction to help decrease symptoms of SOB with activities of daily living.    Expected Outcomes Short Term: Improve cardiorespiratory fitness to achieve a reduction of symptoms when performing ADLs;Long Term: Be able to perform more ADLs without symptoms or delay the onset of symptoms    Increase knowledge of respiratory medications and ability to use respiratory devices properly  Yes  Intervention Provide education and demonstration as needed of appropriate use of medications, inhalers, and oxygen therapy.    Expected Outcomes Short Term: Achieves understanding of medications use. Understands that oxygen is a medication prescribed by physician. Demonstrates appropriate use of inhaler and oxygen therapy.;Long Term: Maintain appropriate use of medications, inhalers, and oxygen therapy.             Education:Diabetes - Individual verbal and written instruction to review signs/symptoms of diabetes, desired ranges of glucose level fasting, after meals and with exercise.  Acknowledge that pre and post exercise glucose checks will be done for 3 sessions at entry of program.   Know Your Numbers and Heart Failure: - Group verbal and visual instruction to discuss disease risk factors for cardiac and pulmonary disease and treatment options.  Reviews associated critical values for Overweight/Obesity, Hypertension, Cholesterol, and Diabetes.  Discusses basics of heart failure: signs/symptoms and treatments.  Introduces Heart Failure Zone chart for action plan for heart failure.  Written material given at graduation. Flowsheet Row Pulmonary Rehab from 05/17/2021 in [facility] Cardiac and Pulmonary Rehab  Date 04/12/21  Educator [facility]  Instruction Review Code 1- Verbalizes Understanding       Core Components/Risk Factors/Patient Goals Review:   Goals and Risk Factor Review     Row Name 04/06/21 1343 05/11/21 1352           Core Components/Risk Factors/Patient Goals Review   Personal Goals Review Improve shortness of breath with ADL's;Increase knowledge of respiratory medications and ability to use respiratory devices properly. Improve shortness of breath with ADL's;Increase knowledge of respiratory medications and ability to use respiratory devices properly.;Weight Management/Obesity      Review Anna Mooney says she has seen some improvement in her breathing - not consistently.  She feels her energy is better.  She does practice PLB.  She doesnt check oxygen at home but plans to get one.  She does take medications as directed. Anna Mooney is doing well in rehab. She has lost some weight and does not want to lose any more.  She is still struggling with her breathing.  Some days are good and some are not so good.  She does know her numbers and stamina are getting better, but still feels SOB.  She is doing well with her meds and using her inhalers as she is supposed to.      Expected Outcomes Short: continue to take meds and work on PLB Long:  improve breathing with ADLs Short: Work on gaining  weight and remembering to eat Long: Continue to work on breathing.               Core Components/Risk Factors/Patient Goals at Discharge (Final Review):   Goals and Risk Factor Review - 05/11/21 1352       Core Components/Risk Factors/Patient Goals Review   Personal Goals Review Improve shortness of breath with ADL's;Increase knowledge of respiratory medications and ability to use respiratory devices properly.;Weight Management/Obesity    Review Anna Mooney is doing well in rehab. She has lost some weight and does not want to lose any more.  She is still struggling with her breathing.  Some days are good and some are not so good.  She does know her numbers and stamina are getting better, but still feels SOB.  She is doing well with her meds and using her inhalers as she is supposed to.    Expected Outcomes Short: Work on gaining weight and remembering to eat Long: Continue to work on breathing.  ITP Comments:  ITP Comments     Row Name 03/03/21 1324 03/13/21 1413 03/13/21 1417 03/29/21 1121 04/26/21 0753   ITP Comments Initial telephone orientation completed. Diagnosis can be found in [facility] 5/16. EP orientation scheduled for Thursday 6/23 at 2pm. Completed 6MWT and gym orientation. Initial ITP created and sent for review to Dr Ottie Glazier, Medical Director. First full day of exercise!  Patient was oriented to gym and equipment including functions, settings, policies, and procedures.  Patient's individual exercise prescription and treatment plan were reviewed.  All starting workloads were established based on the results of the 6 minute walk test done at initial orientation visit.  The plan for exercise progression was also introduced and progression will be customized based on patient's performance and goals. 30 Day review completed. Medical Director ITP review done, changes made as directed, and signed approval by Medical Director.   New to program 30 Day review completed. Medical  Director ITP review done, changes made as directed, and signed approval by Medical Director.    [place] Name 05/24/21 0719           ITP Comments 30 Day review completed. Medical Director ITP review done, changes made as directed, and signed approval by Medical Director.                Comments:

## 2021-05-25 ENCOUNTER — Encounter: Payer: Medicare Other | Admitting: *Deleted

## 2021-05-25 DIAGNOSIS — J449 Chronic obstructive pulmonary disease, unspecified: Secondary | ICD-10-CM

## 2021-05-25 NOTE — Progress Notes (Signed)
Daily Session Note  Patient Details  Name: Yeilyn Gent MRN: [id number] Date of Birth: May 28, 1946 Referring Provider:   Flowsheet Row Pulmonary Rehab from 03/13/2021 in [facility] Cardiac and Pulmonary Rehab  Referring Provider [place]       Encounter Date: 05/25/2021  Check In:  Session Check In - 05/25/21 1339       Check-In   Supervising physician immediately available to respond to emergencies See telemetry face sheet for immediately available ER MD    Location [facility]-Cardiac & Pulmonary Rehab    Staff Present Renita Papa, RN BSN;Joseph [place], RCP,RRT,BSRT;Jessica [place], [place], RCEP, CCRP, CCET    Virtual Visit No    Medication changes reported     No    Fall or balance concerns reported    No    Warm-up and Cool-down Performed on first and last piece of equipment    Resistance Training Performed Yes    VAD Patient? No    PAD/SET Patient? No      Pain Assessment   Currently in Pain? No/denies                Social History   Tobacco Use  Smoking Status Former   Types: Cigarettes   Quit date: 2000   Years since quitting: 22.7  Smokeless Tobacco Never    Goals Met:  Independence with exercise equipment Exercise tolerated well No report of concerns or symptoms today Strength training completed today  Goals Unmet:  Not Applicable  Comments: Pt able to follow exercise prescription today without complaint.  Will continue to monitor for progression.    Dr. Emily Filbert is Medical Director for [place].  Dr. Ottie Glazier is Medical Director for [facility] Pulmonary Rehabilitation.

## 2021-05-29 ENCOUNTER — Other Ambulatory Visit: Payer: Self-pay

## 2021-05-29 DIAGNOSIS — J449 Chronic obstructive pulmonary disease, unspecified: Secondary | ICD-10-CM

## 2021-05-29 NOTE — Progress Notes (Signed)
Daily Session Note  Patient Details  Name: Anna Mooney MRN: [id number] Date of Birth: 03-31-1946 Referring Provider:   Flowsheet Row Pulmonary Rehab from 03/13/2021 in [facility] Cardiac and Pulmonary Rehab  Referring Provider Mosher       Encounter Date: 05/29/2021  Check In:  Session Check In - 05/29/21 1415       Check-In   Supervising physician immediately available to respond to emergencies See telemetry face sheet for immediately available ER MD    Location [facility]-Cardiac & Pulmonary Rehab    Staff Present Birdie Sons, MPA, Nino Glow, MS, ASCM CEP, Exercise Physiologist;Joseph Tessie Fass, [place]    Virtual Visit No    Medication changes reported     No    Fall or balance concerns reported    No    Tobacco Cessation No Change    Warm-up and Cool-down Performed on first and last piece of equipment    Resistance Training Performed Yes    VAD Patient? No    PAD/SET Patient? No      Pain Assessment   Currently in Pain? No/denies                Social History   Tobacco Use  Smoking Status Former   Types: Cigarettes   Quit date: 2000   Years since quitting: 22.7  Smokeless Tobacco Never    Goals Met:  Independence with exercise equipment Exercise tolerated well No report of concerns or symptoms today Strength training completed today  Goals Unmet:  Not Applicable  Comments: .exgoo   Dr. Emily Filbert is Medical Director for [place].  Dr. Ottie Glazier is Medical Director for [facility] Pulmonary Rehabilitation.

## 2021-05-31 ENCOUNTER — Other Ambulatory Visit: Payer: Self-pay

## 2021-05-31 DIAGNOSIS — J449 Chronic obstructive pulmonary disease, unspecified: Secondary | ICD-10-CM

## 2021-05-31 NOTE — Progress Notes (Signed)
Daily Session Note  Patient Details  Name: Anna Mooney MRN: [id number] Date of Birth: 08-01-1946 Referring Provider:   Flowsheet Row Pulmonary Rehab from 03/13/2021 in [facility] Cardiac and Pulmonary Rehab  Referring Provider Mosher       Encounter Date: 05/31/2021  Check In:  Session Check In - 05/31/21 1336       Check-In   Supervising physician immediately available to respond to emergencies See telemetry face sheet for immediately available ER MD    Location [facility]-Cardiac & Pulmonary Rehab    Staff Present Birdie Sons, MPA, Nino Glow, MS, ASCM CEP, Exercise Physiologist;Joseph Tessie Fass, [place]    Virtual Visit No    Medication changes reported     No    Fall or balance concerns reported    No    Tobacco Cessation No Change    Warm-up and Cool-down Performed on first and last piece of equipment    Resistance Training Performed Yes    VAD Patient? No    PAD/SET Patient? No      Pain Assessment   Currently in Pain? No/denies                Social History   Tobacco Use  Smoking Status Former   Types: Cigarettes   Quit date: 2000   Years since quitting: 22.7  Smokeless Tobacco Never    Goals Met:  Independence with exercise equipment Exercise tolerated well No report of concerns or symptoms today Strength training completed today  Goals Unmet:  Not Applicable  Comments: Pt able to follow exercise prescription today without complaint.  Will continue to monitor for progression.    Dr. Emily Filbert is Medical Director for [place].  Dr. Ottie Glazier is Medical Director for [facility] Pulmonary Rehabilitation.

## 2021-06-01 ENCOUNTER — Encounter: Payer: Medicare Other | Admitting: *Deleted

## 2021-06-01 ENCOUNTER — Other Ambulatory Visit: Payer: Self-pay

## 2021-06-01 VITALS — Ht 66.0 in | Wt 135.6 lb

## 2021-06-01 DIAGNOSIS — J449 Chronic obstructive pulmonary disease, unspecified: Secondary | ICD-10-CM

## 2021-06-01 NOTE — Progress Notes (Signed)
Daily Session Note  Patient Details  Name: Anna Mooney MRN: [id number] Date of Birth: Sep 12, 1946 Referring Provider:   Flowsheet Row Pulmonary Rehab from 03/13/2021 in [facility] Cardiac and Pulmonary Rehab  Referring Provider Mosher       Encounter Date: 06/01/2021  Check In:  Session Check In - 06/01/21 1330       Check-In   Supervising physician immediately available to respond to emergencies See telemetry face sheet for immediately available ER MD    Location [facility]-Cardiac & Pulmonary Rehab    Staff Present Renita Papa, RN BSN;Joseph [place], RCP,RRT,BSRT;Jessica [place], [place], RCEP, CCRP, CCET    Virtual Visit No    Medication changes reported     No    Fall or balance concerns reported    No    Warm-up and Cool-down Performed on first and last piece of equipment    Resistance Training Performed Yes    VAD Patient? No    PAD/SET Patient? No      Pain Assessment   Currently in Pain? No/denies                Social History   Tobacco Use  Smoking Status Former   Types: Cigarettes   Quit date: 2000   Years since quitting: 22.7  Smokeless Tobacco Never    Goals Met:  Independence with exercise equipment Exercise tolerated well No report of concerns or symptoms today Strength training completed today  Goals Unmet:  Not Applicable  Comments: Pt able to follow exercise prescription today without complaint.  Will continue to monitor for progression.   [place] Name 03/13/21 1403 06/01/21 1349       6 Minute Walk   Phase Initial Discharge    Distance 500 feet 832 feet    Distance % Change -- 66 %    Distance Feet Change -- 332 ft    Walk Time 4.5 minutes 5.68 minutes    # of Rest Breaks 2 2  28  sec, 13 sec    MPH 1.3 1.58    METS 2 2.36    RPE 17 16    Perceived Dyspnea  3 4    VO2 Peak 7.3 8.27    Symptoms Yes (comment) Yes (comment)    Comments SOB SOB    Resting HR 121 bpm 102 bpm    Resting BP 140/78 122/62    Resting Oxygen  Saturation  96 % 95 %    Exercise Oxygen Saturation  during 6 min walk 92 % 86 %    Max Ex. HR 132 bpm 120 bpm    Max Ex. BP 154/84 136/74    2 Minute Post BP 118/80 134/70         Interval HR   1 Minute HR 121 110    2 Minute HR 129 108    3 Minute HR 132 118    4 Minute HR 127 114    5 Minute HR 124 120    6 Minute HR 126 119    2 Minute Post HR 121 110    Interval Heart Rate? Yes Yes         Interval Oxygen   Interval Oxygen? Yes Yes    Baseline Oxygen Saturation % 96 % 95 %    1 Minute Oxygen Saturation % 95 % 94 %    1 Minute Liters of Oxygen 2 L 2 L  pulsed    2 Minute Oxygen  Saturation % 93 % 92 %    2 Minute Liters of Oxygen 2 L 2 L    3 Minute Oxygen Saturation % 92 % 89 %    3 Minute Liters of Oxygen 2 L 2 L    4 Minute Oxygen Saturation % 94 % 89 %    4 Minute Liters of Oxygen 2 L 2 L    5 Minute Oxygen Saturation % 93 % 87 %    5 Minute Liters of Oxygen 2 L 2 L    6 Minute Oxygen Saturation % 92 % 86 %    6 Minute Liters of Oxygen 2 L 2 L    2 Minute Post Oxygen Saturation % 96 % 91 %    2 Minute Post Liters of Oxygen 2 L 2 L              Dr. Emily Filbert is Medical Director for [facility].  Dr. Ottie Glazier is Medical Director for [facility] Pulmonary Rehabilitation.

## 2021-06-01 NOTE — Patient Instructions (Signed)
Discharge Patient Instructions  Patient Details  Name: Anna Mooney MRN: [id number] Date of Birth: [birth year] Referring Provider:  Lendon Ka, MD   Number of Visits: 39  Reason for Discharge:  Patient reached a stable level of exercise. Patient independent in their exercise. Patient has met program and personal goals.  Smoking History:  Social History   Tobacco Use  Smoking Status Former   Types: Cigarettes   Quit date: 2000   Years since quitting: 22.7  Smokeless Tobacco Never    Diagnosis:  Chronic obstructive pulmonary disease, unspecified COPD type (Anna Mooney)  Initial Exercise Prescription:  Initial Exercise Prescription - 03/13/21 1400       Date of Initial Exercise RX and Referring Provider   Date 03/13/21    Referring Provider Anna Mooney      Oxygen   Oxygen Continuous    Liters 2    Maintain Oxygen Saturation 88% or higher      Treadmill   MPH 1.3    Grade 0    Minutes 15   rest as needed   METs 2      Recumbant Bike   Level 1    RPM 60    Minutes 15    METs 2      NuStep   Level 1    SPM 80    Minutes 15    METs 2      T5 Nustep   Level 1    SPM 80    Minutes 15    METs 2      Biostep-RELP   Level 1    SPM 50    Minutes 15    METs 2      Prescription Details   Frequency (times per week) 3    Duration Progress to 30 minutes of continuous aerobic without signs/symptoms of physical distress      Intensity   THRR 40-80% of Max Heartrate 131-141    Ratings of Perceived Exertion 11-13    Perceived Dyspnea 0-4      Resistance Training   Training Prescription Yes    Weight 3 lb    Reps 10-15             Discharge Exercise Prescription (Final Exercise Prescription Changes):  Exercise Prescription Changes - 05/24/21 0900       Response to Exercise   Blood Pressure (Admit) 132/72    Blood Pressure (Exit) 118/74    Heart Rate (Admit) 105 bpm    Heart Rate (Exercise) 16 bpm    Heart Rate (Exit) 109 bpm    Oxygen Saturation  (Admit) 95 %    Oxygen Saturation (Exercise) 96 %    Oxygen Saturation (Exit) 96 %    Rating of Perceived Exertion (Exercise) 13    Perceived Dyspnea (Exercise) 3    Symptoms SOB    Duration Continue with 30 min of aerobic exercise without signs/symptoms of physical distress.    Intensity THRR unchanged      Progression   Progression Continue to progress workloads to maintain intensity without signs/symptoms of physical distress.    Average METs 2      Resistance Training   Training Prescription Yes    Weight 3 lb    Reps 10-15      Interval Training   Interval Training No      Oxygen   Oxygen Continuous    Liters 2      Treadmill   MPH 1.2    Grade  1    Minutes 15    METs 2.08      Arm Ergometer   Level 1    Minutes 15    METs 1.9      Home Exercise Plan   Plans to continue exercise at Home (comment)   walking, treadmill   Frequency Add 2 additional days to program exercise sessions.    Initial Home Exercises Provided 04/06/21      Oxygen   Maintain Oxygen Saturation 88% or higher             Functional Capacity:  6 Minute Walk     Row Name 03/13/21 1403 06/01/21 1349       6 Minute Walk   Phase Initial Discharge    Distance 500 feet 832 feet    Distance % Change -- 66 %    Distance Feet Change -- 332 ft    Walk Time 4.5 minutes 5.68 minutes    # of Rest Breaks _0 sec, 13 sec    MPH 1.3 1.58    METS 2 2.36    RPE 17 16    Perceived Dyspnea  3 4    VO2 Peak 7.3 8.27    Symptoms Yes (comment) Yes (comment)    Comments SOB SOB    Resting HR 121 bpm 102 bpm    Resting BP 140/78 122/62    Resting Oxygen Saturation  96 % 95 %    Exercise Oxygen Saturation  during 6 min walk 92 % 86 %    Max Ex. HR 132 bpm 120 bpm    Max Ex. BP 154/84 136/74    2 Minute Post BP 118/80 134/70         Interval HR   1 Minute HR 121 110    2 Minute HR 129 108    3 Minute HR 132 118    4 Minute HR 127 114    5 Minute HR 124 120    6 Minute HR 126 119     2 Minute Post HR 121 110    Interval Heart Rate? Yes Yes         Interval Oxygen   Interval Oxygen? Yes Yes    Baseline Oxygen Saturation % 96 % 95 %    1 Minute Oxygen Saturation % 95 % 94 %    1 Minute Liters of Oxygen 2 L 2 L  pulsed    2 Minute Oxygen Saturation % 93 % 92 %    2 Minute Liters of Oxygen 2 L 2 L    3 Minute Oxygen Saturation % 92 % 89 %    3 Minute Liters of Oxygen 2 L 2 L    4 Minute Oxygen Saturation % 94 % 89 %    4 Minute Liters of Oxygen 2 L 2 L    5 Minute Oxygen Saturation % 93 % 87 %    5 Minute Liters of Oxygen 2 L 2 L    6 Minute Oxygen Saturation % 92 % 86 %    6 Minute Liters of Oxygen 2 L 2 L    2 Minute Post Oxygen Saturation % 96 % 91 %    2 Minute Post Liters of Oxygen 2 L 2 L            Nutrition & Weight - Outcomes:  Pre Biometrics - 03/13/21 1410       Pre Biometrics   Height  5' 6" (1.676 m)    Weight 139 lb 14.4 oz (63.5 kg)    BMI (Calculated) 22.59    Single Leg Stand 6.34 seconds             Post Biometrics - 06/01/21 1352        Post  Biometrics   Height 5' 6" (1.676 m)    Weight 135 lb 9.6 oz (61.5 kg)    BMI (Calculated) 21.9    Single Leg Stand 10 seconds             Nutrition:  Nutrition Therapy & Goals - 04/03/21 1511       Nutrition Therapy   Diet Heart healthy, low Na, pulmonary friendly    Protein (specify units) 75g    Fiber 25 grams    Whole Grain Foods 3 servings    Saturated Fats 12 max. grams    Fruits and Vegetables 8 servings/day    Sodium 1.5 grams      Personal Nutrition Goals   Nutrition Goal ST: add in protein snack like apple with peanut butter, pt is on the fence regarding protein shake, wants to try out prepared meals LT: limit Na < 1.5g/day, eat enough protein    Comments Anna Mooney reports having a poor appetite and will make herself eat. She used to salt everything she ate, but no longer does this. She continues to go out to eat 2x/day breakfast and lunch. Bacon is her favorite food-  for breakfast yesterday she had bacon and egg and an apple and for dinner a salad - she threw out the fried chicken. She enjoys beans and does not care for meat much anymore. She reports not wanting to prepare meals and would be interested in a meal kit. Discussed some options she could try and the pros/cons of doing so. Discussed heart healthy and pulmonary friendly eating.      Intervention Plan   Intervention Prescribe, educate and counsel regarding individualized specific dietary modifications aiming towards targeted core components such as weight, hypertension, lipid management, diabetes, heart failure and other comorbidities.;Nutrition handout(s) given to patient.    Expected Outcomes Short Term Goal: Understand basic principles of dietary content, such as calories, fat, sodium, cholesterol and nutrients.;Short Term Goal: A plan has been developed with personal nutrition goals set during dietitian appointment.;Long Term Goal: Adherence to prescribed nutrition plan.             Goals reviewed with patient; copy given to patient. 

## 2021-06-12 ENCOUNTER — Other Ambulatory Visit: Payer: Self-pay

## 2021-06-12 DIAGNOSIS — J449 Chronic obstructive pulmonary disease, unspecified: Secondary | ICD-10-CM

## 2021-06-12 NOTE — Progress Notes (Signed)
Daily Session Note  Patient Details  Name: Anna Mooney MRN: [id number] Date of Birth: May 19, 1946 Referring Provider:   Flowsheet Row Pulmonary Rehab from 03/13/2021 in [facility] Cardiac and Pulmonary Rehab  Referring Provider [place]       Encounter Date: 06/12/2021  Check In:  Session Check In - 06/12/21 1333       Check-In   Supervising physician immediately available to respond to emergencies See telemetry face sheet for immediately available ER MD    Location [facility]-Cardiac & Pulmonary Rehab    Staff Present Birdie Sons, MPA, Nino Glow, MS, ASCM CEP, Exercise Physiologist;Amanda Oletta Darter, BA, ACSM CEP, Exercise Physiologist    Virtual Visit No    Medication changes reported     No    Fall or balance concerns reported    No    Tobacco Cessation No Change    Warm-up and Cool-down Performed on first and last piece of equipment    Resistance Training Performed Yes    VAD Patient? No    PAD/SET Patient? No      Pain Assessment   Currently in Pain? No/denies                Social History   Tobacco Use  Smoking Status Former   Types: Cigarettes   Quit date: 2000   Years since quitting: 22.7  Smokeless Tobacco Never    Goals Met:  Independence with exercise equipment Exercise tolerated well No report of concerns or symptoms today Strength training completed today  Goals Unmet:  Not Applicable  Comments: Pt able to follow exercise prescription today without complaint.  Will continue to monitor for progression.    Dr. Emily Filbert is Medical Director for [place].  Dr. Ottie Glazier is Medical Director for [facility] Pulmonary Rehabilitation.

## 2021-06-14 ENCOUNTER — Other Ambulatory Visit: Payer: Self-pay

## 2021-06-14 DIAGNOSIS — J449 Chronic obstructive pulmonary disease, unspecified: Secondary | ICD-10-CM | POA: Diagnosis not present

## 2021-06-14 NOTE — Progress Notes (Signed)
Discharge Progress Report  Patient Details  Name: Anna Mooney MRN: [id number] Date of Birth: 10/27/1945 Referring Provider:   Flowsheet Row Pulmonary Rehab from 03/13/2021 in [facility]  Referring Provider Mosher        Number of Visits: 36  Reason for Discharge:  Patient reached a stable level of exercise. Patient independent in their exercise. Patient has met program and personal goals.  Smoking History:  Social History   Tobacco Use  Smoking Status Former   Types: Cigarettes   Quit date: 2000   Years since quitting: 22.7  Smokeless Tobacco Never    Diagnosis:  Chronic obstructive pulmonary disease, unspecified COPD type (HCC)  ADL [facility]:  Pulmonary Assessment Scores     Row Name 03/09/21 1513 03/13/21 1403 05/31/21 1450     ADL [facility]   ADL Phase -- -- Exit   SOB Score total 80 -- 73   Rest 1 -- 0   Walk 4 -- 4   Stairs 5 -- 5   Bath 4 -- 3   Dress 3 -- 3   Shop 4 -- 4     CAT Score   CAT Score -- 18 20     mMRC Score   mMRC Score -- 3 --    Row Name 06/01/21 1354         ADL [facility]   ADL Phase Exit     SOB Score total 73     Rest 0     Walk 4     Stairs 5     Bath 3     Dress 3     Shop 4           CAT Score   CAT Score 20           mMRC Score   mMRC Score 2              Initial Exercise Prescription:  Initial Exercise Prescription - 03/13/21 1400       Date of Initial Exercise RX and Referring Provider   Date 03/13/21    Referring Provider Mosher      Oxygen   Oxygen Continuous    Liters 2    Maintain Oxygen Saturation 88% or higher      Treadmill   MPH 1.3    Grade 0    Minutes 15   rest as needed   METs 2      Recumbant Bike   Level 1    RPM 60    Minutes 15    METs 2      NuStep   Level 1    SPM 80    Minutes 15    METs 2      T5 Nustep   Level 1    SPM 80    Minutes 15    METs 2      Biostep-RELP   Level 1    SPM 50    Minutes 15    METs 2      Prescription Details    Frequency (times per week) 3    Duration Progress to 30 minutes of continuous aerobic without signs/symptoms of physical distress      Intensity   THRR 40-80% of Max Heartrate 131-141    Ratings of Perceived Exertion 11-13    Perceived Dyspnea 0-4      Resistance Training   Training Prescription Yes    Weight 3 lb      Reps 10-15             Discharge Exercise Prescription (Final Exercise Prescription Changes):  Exercise Prescription Changes - 06/05/21 1600       Response to Exercise   Blood Pressure (Admit) 122/62    Blood Pressure (Exercise) 136/74    Blood Pressure (Exit) 112/60    Heart Rate (Admit) 102 bpm    Heart Rate (Exercise) 120 bpm    Heart Rate (Exit) 106 bpm    Oxygen Saturation (Admit) 95 %    Oxygen Saturation (Exercise) 86 %    Oxygen Saturation (Exit) 95 %    Rating of Perceived Exertion (Exercise) 16    Perceived Dyspnea (Exercise) 4    Symptoms SOB    Duration Continue with 30 min of aerobic exercise without signs/symptoms of physical distress.    Intensity THRR unchanged      Progression   Progression Continue to progress workloads to maintain intensity without signs/symptoms of physical distress.    Average METs 2.1      Resistance Training   Training Prescription Yes    Weight 3 lb    Reps 10-15      Interval Training   Interval Training No      Oxygen   Oxygen Continuous    Liters 2      Treadmill   MPH 1.2    Grade 1    Minutes 15    METs 2.08      NuStep   Level 4    Minutes 15    METs 2      T5 Nustep   Level 1    Minutes 15    METs 2      Home Exercise Plan   Plans to continue exercise at Home (comment)   walking, treadmill   Frequency Add 2 additional days to program exercise sessions.    Initial Home Exercises Provided 04/06/21      Oxygen   Maintain Oxygen Saturation 88% or higher             Functional Capacity:  6 Minute Walk     Row Name 03/13/21 1403 06/01/21 1349       6 Minute Walk   Phase  Initial Discharge    Distance 500 feet 832 feet    Distance % Change -- 66 %    Distance Feet Change -- 332 ft    Walk Time 4.5 minutes 5.68 minutes    # of Rest Breaks _0 sec, 13 sec    MPH 1.3 1.58    METS 2 2.36    RPE 17 16    Perceived Dyspnea  3 4    VO2 Peak 7.3 8.27    Symptoms Yes (comment) Yes (comment)    Comments SOB SOB    Resting HR 121 bpm 102 bpm    Resting BP 140/78 122/62    Resting Oxygen Saturation  96 % 95 %    Exercise Oxygen Saturation  during 6 min walk 92 % 86 %    Max Ex. HR 132 bpm 120 bpm    Max Ex. BP 154/84 136/74    2 Minute Post BP 118/80 134/70         Interval HR   1 Minute HR 121 110    2 Minute HR 129 108    3 Minute HR 132 118    4 Minute HR 127 114    5 Minute HR  124 120    6 Minute HR 126 119    2 Minute Post HR 121 110    Interval Heart Rate? Yes Yes         Interval Oxygen   Interval Oxygen? Yes Yes    Baseline Oxygen Saturation % 96 % 95 %    1 Minute Oxygen Saturation % 95 % 94 %    1 Minute Liters of Oxygen 2 L 2 L  pulsed    2 Minute Oxygen Saturation % 93 % 92 %    2 Minute Liters of Oxygen 2 L 2 L    3 Minute Oxygen Saturation % 92 % 89 %    3 Minute Liters of Oxygen 2 L 2 L    4 Minute Oxygen Saturation % 94 % 89 %    4 Minute Liters of Oxygen 2 L 2 L    5 Minute Oxygen Saturation % 93 % 87 %    5 Minute Liters of Oxygen 2 L 2 L    6 Minute Oxygen Saturation % 92 % 86 %    6 Minute Liters of Oxygen 2 L 2 L    2 Minute Post Oxygen Saturation % 96 % 91 %    2 Minute Post Liters of Oxygen 2 L 2 L             Psychological, QOL, Others - Outcomes: PHQ 2/9: Depression screen PHQ 2/9 06/01/2021 04/03/2021 03/09/2021  Decreased Interest 1 0 1  Down, Depressed, Hopeless 1 0 0  PHQ - 2 Score 2 0 1  Altered sleeping 1 1 3  Tired, decreased energy 1 1 3  Change in appetite 1 1 2  Feeling bad or failure about yourself  0 0 0  Trouble concentrating 0 0 0  Moving slowly or fidgety/restless 0 0 0  Suicidal  thoughts 0 0 0  PHQ-9 Score 5 3 9  Difficult doing work/chores Somewhat difficult Not difficult at all -    Quality of Life:    Nutrition & Weight - Outcomes:  Pre Biometrics - 03/13/21 1410       Pre Biometrics   Height 5' 6" (1.676 m)    Weight 139 lb 14.4 oz (63.5 kg)    BMI (Calculated) 22.59    Single Leg Stand 6.34 seconds             Post Biometrics - 06/01/21 1352        Post  Biometrics   Height 5' 6" (1.676 m)    Weight 135 lb 9.6 oz (61.5 kg)    BMI (Calculated) 21.9    Single Leg Stand 10 seconds             Nutrition:  Nutrition Therapy & Goals - 04/03/21 1511       Nutrition Therapy   Diet Heart healthy, low Na, pulmonary friendly    Protein (specify units) 75g    Fiber 25 grams    Whole Grain Foods 3 servings    Saturated Fats 12 max. grams    Fruits and Vegetables 8 servings/day    Sodium 1.5 grams      Personal Nutrition Goals   Nutrition Goal ST: add in protein snack like apple with peanut butter, pt is on the fence regarding protein shake, wants to try out prepared meals LT: limit Na < 1.5g/day, eat enough protein    Comments [name] reports having a poor appetite and will make herself eat. She   used to salt everything she ate, but no longer does this. She continues to go out to eat 2x/day breakfast and lunch. Bacon is her favorite food- for breakfast yesterday she had bacon and egg and an apple and for dinner a salad - she threw out the fried chicken. She enjoys beans and does not care for meat much anymore. She reports not wanting to prepare meals and would be interested in a meal kit. Discussed some options she could try and the pros/cons of doing so. Discussed heart healthy and pulmonary friendly eating.      Intervention Plan   Intervention Prescribe, educate and counsel regarding individualized specific dietary modifications aiming towards targeted core components such as weight, hypertension, lipid management, diabetes, heart failure and  other comorbidities.;Nutrition handout(s) given to patient.    Expected Outcomes Short Term Goal: Understand basic principles of dietary content, such as calories, fat, sodium, cholesterol and nutrients.;Short Term Goal: A plan has been developed with personal nutrition goals set during dietitian appointment.;Long Term Goal: Adherence to prescribed nutrition plan.             Nutrition Discharge:   Education Questionnaire Score:  Knowledge Questionnaire Score - 05/31/21 1444       Knowledge Questionnaire Score   Post Score 18/18             Goals reviewed with patient; copy given to patient. 

## 2021-06-14 NOTE — Progress Notes (Signed)
Pulmonary Individual Treatment Plan  Patient Details  Name: Anna Mooney MRN: [id number] Date of Birth: Feb 08, 1946 Referring Provider:   Flowsheet Row Pulmonary Rehab from 03/13/2021 in [facility] Cardiac and Pulmonary Rehab  Referring Provider Mosher       Initial Encounter Date:  Flowsheet Row Pulmonary Rehab from 03/13/2021 in [facility] Cardiac and Pulmonary Rehab  Date 03/13/21       Visit Diagnosis: Chronic obstructive pulmonary disease, unspecified COPD type ([place])  Patient's Home Medications on Admission:  Current Outpatient Medications:    albuterol (VENTOLIN HFA) 108 (90 Base) MCG/ACT inhaler, Inhale into the lungs., Disp: , Rfl:    Calcium Carbonate-Vitamin D 600-200 MG-UNIT TABS, Take 1 tablet by mouth daily., Disp: , Rfl:    ibuprofen (ADVIL) 200 MG tablet, Take by mouth., Disp: , Rfl:    ipratropium-albuterol (DUONEB) 0.5-2.5 (3) MG/3ML SOLN, Inhale into the lungs., Disp: , Rfl:    Melatonin 10 MG TABS, Take by mouth., Disp: , Rfl:    montelukast (SINGULAIR) 10 MG tablet, SMARTSIG:1 Tablet(s) By Mouth Every Evening, Disp: , Rfl:    TRELEGY ELLIPTA 100-62.5-25 MCG/INH AEPB, Inhale 1 puff into the lungs daily., Disp: , Rfl:   Past Medical History: No past medical history on file.  Tobacco Use: Social History   Tobacco Use  Smoking Status Former   Types: Cigarettes   Quit date: 2000   Years since quitting: 22.7  Smokeless Tobacco Never    Labs: Recent Review Flowsheet Data   There is no flowsheet data to display.      Pulmonary Assessment Scores:  Pulmonary Assessment Scores     Row Name 03/09/21 1513 03/13/21 1403 05/31/21 1450     ADL UCSD   ADL Phase -- -- Exit   SOB Score total 80 -- 73   Rest 1 -- 0   Walk 4 -- 4   Stairs 5 -- 5   Bath 4 -- 3   Dress 3 -- 3   Shop 4 -- 4     CAT Score   CAT Score -- 18 20     mMRC Score   mMRC Score -- 3 --    Row Name 06/01/21 1354         ADL UCSD   ADL Phase Exit     SOB Score total 73     Rest 0      Walk 4     Stairs 5     Bath 3     Dress 3     Shop 4           CAT Score   CAT Score 20           mMRC Score   mMRC Score 2              UCSD: Self-administered rating of dyspnea associated with activities of daily living (ADLs) 6-point scale (0 = "not at all" to 5 = "maximal or unable to do because of breathlessness")  Scoring Scores range from 0 to 120.  Minimally important difference is 5 units  CAT: CAT can identify the health impairment of COPD patients and is better correlated with disease progression.  CAT has a scoring range of zero to 40. The CAT score is classified into four groups of low (less than 10), medium (10 - 20), high (21-30) and very high (31-40) based on the impact level of disease on health status. A CAT score over 10 suggests significant symptoms.  A worsening  CAT score could be explained by an exacerbation, poor medication adherence, poor inhaler technique, or progression of COPD or comorbid conditions.  CAT MCID is 2 points  mMRC: mMRC (Modified Medical Research Council) Dyspnea Scale is used to assess the degree of baseline functional disability in patients of respiratory disease due to dyspnea. No minimal important difference is established. A decrease in score of 1 point or greater is considered a positive change.   Pulmonary Function Assessment:   Exercise Target Goals: Exercise Program Goal: Individual exercise prescription set using results from initial 6 min walk test and THRR while considering  patient's activity barriers and safety.   Exercise Prescription Goal: Initial exercise prescription builds to 30-45 minutes a day of aerobic activity, 2-3 days per week.  Home exercise guidelines will be given to patient during program as part of exercise prescription that the participant will acknowledge.  Education: Aerobic Exercise: - Group verbal and visual presentation on the components of exercise prescription. Introduces F.I.T.T principle  from ACSM for exercise prescriptions.  Reviews F.I.T.T. principles of aerobic exercise including progression. Written material given at graduation. Flowsheet Row Pulmonary Rehab from 05/17/2021 in [facility] Cardiac and Pulmonary Rehab  Date 05/10/21  Educator AS  Instruction Review Code 1- Verbalizes Understanding       Education: Resistance Exercise: - Group verbal and visual presentation on the components of exercise prescription. Introduces F.I.T.T principle from ACSM for exercise prescriptions  Reviews F.I.T.T. principles of resistance exercise including progression. Written material given at graduation. Flowsheet Row Pulmonary Rehab from 05/17/2021 in [facility] Cardiac and Pulmonary Rehab  Date 03/15/21  Educator [facility]  Instruction Review Code 1- Verbalizes Understanding        Education: Exercise & Equipment Safety: - Individual verbal instruction and demonstration of equipment use and safety with use of the equipment. Flowsheet Row Pulmonary Rehab from 05/17/2021 in [facility] Cardiac and Pulmonary Rehab  Date 03/09/21  Educator AS  Instruction Review Code 1- Verbalizes Understanding       Education: Exercise Physiology & General Exercise Guidelines: - Group verbal and written instruction with models to review the exercise physiology of the cardiovascular system and associated critical values. Provides general exercise guidelines with specific guidelines to those with heart or lung disease.  Flowsheet Row Pulmonary Rehab from 05/17/2021 in [facility] Cardiac and Pulmonary Rehab  Date 05/03/21  Educator AS  Instruction Review Code 1- Verbalizes Understanding       Education: Flexibility, Balance, Mind/Body Relaxation: - Group verbal and visual presentation with interactive activity on the components of exercise prescription. Introduces F.I.T.T principle from ACSM for exercise prescriptions. Reviews F.I.T.T. principles of flexibility and balance exercise training including progression. Also discusses  the mind body connection.  Reviews various relaxation techniques to help reduce and manage stress (i.e. Deep breathing, progressive muscle relaxation, and visualization). Balance handout provided to take home. Written material given at graduation. Flowsheet Row Pulmonary Rehab from 05/17/2021 in [facility] Cardiac and Pulmonary Rehab  Date 03/22/21  Educator [facility]  Instruction Review Code 1- Verbalizes Understanding       Activity Barriers & Risk Stratification:  Activity Barriers & Cardiac Risk Stratification - 03/03/21 1307       Activity Barriers & Cardiac Risk Stratification   Activity Barriers None             6 Minute Walk:  6 Minute Walk     Row Name 03/13/21 1403 06/01/21 1349       6 Minute Walk   Phase Initial Discharge  Distance 500 feet 832 feet    Distance % Change -- 66 %    Distance Feet Change -- 332 ft    Walk Time 4.5 minutes 5.68 minutes    # of Rest Breaks _0 sec, 13 sec    MPH 1.3 1.58    METS 2 2.36    RPE 17 16    Perceived Dyspnea  3 4    VO2 Peak 7.3 8.27    Symptoms Yes (comment) Yes (comment)    Comments SOB SOB    Resting HR 121 bpm 102 bpm    Resting BP 140/78 122/62    Resting Oxygen Saturation  96 % 95 %    Exercise Oxygen Saturation  during 6 min walk 92 % 86 %    Max Ex. HR 132 bpm 120 bpm    Max Ex. BP 154/84 136/74    2 Minute Post BP 118/80 134/70         Interval HR   1 Minute HR 121 110    2 Minute HR 129 108    3 Minute HR 132 118    4 Minute HR 127 114    5 Minute HR 124 120    6 Minute HR 126 119    2 Minute Post HR 121 110    Interval Heart Rate? Yes Yes         Interval Oxygen   Interval Oxygen? Yes Yes    Baseline Oxygen Saturation % 96 % 95 %    1 Minute Oxygen Saturation % 95 % 94 %    1 Minute Liters of Oxygen 2 L 2 L  pulsed    2 Minute Oxygen Saturation % 93 % 92 %    2 Minute Liters of Oxygen 2 L 2 L    3 Minute Oxygen Saturation % 92 % 89 %    3 Minute Liters of Oxygen 2 L 2 L    4 Minute Oxygen  Saturation % 94 % 89 %    4 Minute Liters of Oxygen 2 L 2 L    5 Minute Oxygen Saturation % 93 % 87 %    5 Minute Liters of Oxygen 2 L 2 L    6 Minute Oxygen Saturation % 92 % 86 %    6 Minute Liters of Oxygen 2 L 2 L    2 Minute Post Oxygen Saturation % 96 % 91 %    2 Minute Post Liters of Oxygen 2 L 2 L            Oxygen Initial Assessment:  Oxygen Initial Assessment - 03/03/21 1305       Home Oxygen   Home Oxygen Device Home Concentrator;Portable Concentrator    Sleep Oxygen Prescription Continuous    Liters per minute 2    Home Exercise Oxygen Prescription Continuous    Liters per minute 2    Home Resting Oxygen Prescription Continuous    Liters per minute 2    Compliance with Home Oxygen Use Yes      Intervention   Short Term Goals To learn and exhibit compliance with exercise, home and travel O2 prescription;To learn and understand importance of monitoring SPO2 with pulse oximeter and demonstrate accurate use of the pulse oximeter.;To learn and understand importance of maintaining oxygen saturations>88%;To learn and demonstrate proper pursed lip breathing techniques or other breathing techniques. ;To learn and demonstrate proper use of respiratory medications    Long  Term Goals Exhibits compliance with exercise, home  and travel O2 prescription;Verbalizes importance of monitoring SPO2 with pulse oximeter and return demonstration;Maintenance of O2 saturations>88%;Exhibits proper breathing techniques, such as pursed lip breathing or other method taught during program session;Compliance with respiratory medication;Demonstrates proper use of MDI's             Oxygen Re-Evaluation:  Oxygen Re-Evaluation     Row Name 03/13/21 1419 04/06/21 1347 05/11/21 1356 05/29/21 1423       Program Oxygen Prescription   Program Oxygen Prescription -- Continuous Continuous Continuous    Liters per minute -- _0 Home Oxygen   Home Oxygen Device Home Concentrator;Portable  Concentrator Home Concentrator;Portable Concentrator Home Concentrator;Portable Concentrator Home Concentrator;Portable Concentrator    Sleep Oxygen Prescription Continuous Continuous Continuous Continuous    Liters per minute _1 Home Exercise Oxygen Prescription Continuous Continuous Continuous Continuous    Liters per minute _2 Home Resting Oxygen Prescription Continuous Continuous Continuous --    Liters per minute _3 --    Compliance with Home Oxygen Use Yes Yes Yes Yes         Goals/Expected Outcomes   Short Term Goals To learn and exhibit compliance with exercise, home and travel O2 prescription;To learn and understand importance of monitoring SPO2 with pulse oximeter and demonstrate accurate use of the pulse oximeter.;To learn and understand importance of maintaining oxygen saturations>88%;To learn and demonstrate proper pursed lip breathing techniques or other breathing techniques.  -- To learn and exhibit compliance with exercise, home and travel O2 prescription;To learn and understand importance of monitoring SPO2 with pulse oximeter and demonstrate accurate use of the pulse oximeter.;To learn and understand importance of maintaining oxygen saturations>88%;To learn and demonstrate proper pursed lip breathing techniques or other breathing techniques.  To learn and exhibit compliance with exercise, home and travel O2 prescription;To learn and understand importance of monitoring SPO2 with pulse oximeter and demonstrate accurate use of the pulse oximeter.;To learn and understand importance of maintaining oxygen saturations>88%;To learn and demonstrate proper pursed lip breathing techniques or other breathing techniques.     Long  Term Goals Exhibits compliance with exercise, home  and travel O2 prescription;Verbalizes importance of monitoring SPO2 with pulse oximeter and return demonstration;Maintenance of O2 saturations>88%;Exhibits proper breathing techniques, such as pursed  lip breathing or other method taught during program session -- Exhibits compliance with exercise, home  and travel O2 prescription;Verbalizes importance of monitoring SPO2 with pulse oximeter and return demonstration;Maintenance of O2 saturations>88%;Exhibits proper breathing techniques, such as pursed lip breathing or other method taught during program session;Compliance with respiratory medication;Demonstrates proper use of MDI's Exhibits compliance with exercise, home  and travel O2 prescription;Verbalizes importance of monitoring SPO2 with pulse oximeter and return demonstration;Maintenance of O2 saturations>88%;Exhibits proper breathing techniques, such as pursed lip breathing or other method taught during program session;Compliance with respiratory medication;Demonstrates proper use of MDI's    Comments Reviewed PLB technique with pt.  Talked about how it works and it's importance in maintaining their exercise saturations. Anna Mooney feels she sees improvements in breathing some days but not consistently. Anna Mooney is doing well in rehab.  Her breathing still gives her trouble, but she has noticed that it is getting better. She is able to do more now than she used to.  She is doing well on her oxygen and using her inhalers.  She likes her PLB when  she gets short of breath. Anna Mooney does osometimes go without oxygen while at home.  Staff reviewed monitoring sats to make sue they remained above 88%.    Goals/Expected Outcomes Short: Become more profiecient at using PLB.   Long: Become independent at using PLB. Short: continue to work on PLB Long: usegood PLB technique as needed Short: Continue to use PLB Long: Continue to improve breathing. Short: monitor sats at home Long: improve SOB,become proficient at PLB             Oxygen Discharge (Final Oxygen Re-Evaluation):  Oxygen Re-Evaluation - 05/29/21 1423       Program Oxygen Prescription   Program Oxygen Prescription Continuous    Liters per minute 2       Home Oxygen   Home Oxygen Device Home Concentrator;Portable Concentrator    Sleep Oxygen Prescription Continuous    Liters per minute 2    Home Exercise Oxygen Prescription Continuous    Liters per minute 2    Compliance with Home Oxygen Use Yes      Goals/Expected Outcomes   Short Term Goals To learn and exhibit compliance with exercise, home and travel O2 prescription;To learn and understand importance of monitoring SPO2 with pulse oximeter and demonstrate accurate use of the pulse oximeter.;To learn and understand importance of maintaining oxygen saturations>88%;To learn and demonstrate proper pursed lip breathing techniques or other breathing techniques.     Long  Term Goals Exhibits compliance with exercise, home  and travel O2 prescription;Verbalizes importance of monitoring SPO2 with pulse oximeter and return demonstration;Maintenance of O2 saturations>88%;Exhibits proper breathing techniques, such as pursed lip breathing or other method taught during program session;Compliance with respiratory medication;Demonstrates proper use of MDI's    Comments Anna Mooney does osometimes go without oxygen while at home.  Staff reviewed monitoring sats to make sue they remained above 88%.    Goals/Expected Outcomes Short: monitor sats at home Long: improve SOB,become proficient at PLB             Initial Exercise Prescription:  Initial Exercise Prescription - 03/13/21 1400       Date of Initial Exercise RX and Referring Provider   Date 03/13/21    Referring Provider Mosher      Oxygen   Oxygen Continuous    Liters 2    Maintain Oxygen Saturation 88% or higher      Treadmill   MPH 1.3    Grade 0    Minutes 15   rest as needed   METs 2      Recumbant Bike   Level 1    RPM 60    Minutes 15    METs 2      NuStep   Level 1    SPM 80    Minutes 15    METs 2      T5 Nustep   Level 1    SPM 80    Minutes 15    METs 2      Biostep-RELP   Level 1    SPM 50    Minutes 15    METs  2      Prescription Details   Frequency (times per week) 3    Duration Progress to 30 minutes of continuous aerobic without signs/symptoms of physical distress      Intensity   THRR 40-80% of Max Heartrate 131-141    Ratings of Perceived Exertion 11-13    Perceived Dyspnea 0-4      Resistance  Training   Training Prescription Yes    Weight 3 lb    Reps 10-15             Perform Capillary Blood Glucose checks as needed.  Exercise Prescription Changes:   Exercise Prescription Changes     Row Name 03/13/21 1400 03/27/21 1000 04/10/21 1500 04/25/21 1200 05/11/21 1100     Response to Exercise   Blood Pressure (Admit) 140/78 150/84 120/64 140/76 112/60   Blood Pressure (Exercise) 154/84 142/86 -- -- --   Blood Pressure (Exit) 118/80 100/60 120/70 122/70 112/60   Heart Rate (Admit) 121 bpm 102 bpm 105 bpm 109 bpm 90 bpm   Heart Rate (Exercise) 132 bpm 109 bpm 113 bpm 113 bpm 107 bpm   Heart Rate (Exit) 121 bpm 99 bpm 111 bpm 98 bpm 97 bpm   Oxygen Saturation (Admit) 96 % 95 % 97 % 96 % 98 %   Oxygen Saturation (Exercise) 92 % 93 % 97 % 96 % 99 %   Oxygen Saturation (Exit) 96 % 99 % 97 % 98 % 97 %   Rating of Perceived Exertion (Exercise) _0 Perceived Dyspnea (Exercise) _1 Symptoms SOB SOB SOB SOB,fatigue SOB   Comments -- first full day of exercise -- -- --   Duration -- Progress to 30 minutes of  aerobic without signs/symptoms of physical distress Continue with 30 min of aerobic exercise without signs/symptoms of physical distress. Continue with 30 min of aerobic exercise without signs/symptoms of physical distress. Continue with 30 min of aerobic exercise without signs/symptoms of physical distress.   Intensity -- THRR unchanged THRR unchanged THRR unchanged THRR unchanged     Progression   Progression -- Continue to progress workloads to maintain intensity without signs/symptoms of physical distress. Continue to progress workloads to maintain  intensity without signs/symptoms of physical distress. Continue to progress workloads to maintain intensity without signs/symptoms of physical distress. Continue to progress workloads to maintain intensity without signs/symptoms of physical distress.   Average METs -- 1.77 1.93 1.8 1.96     Resistance Training   Training Prescription -- Yes Yes Yes Yes   Weight -- 3 lb 3 lb 3 lb 3 lb   Reps -- 10-15 10-15 10-15 10-15     Interval Training   Interval Training -- No No No No     Oxygen   Oxygen -- Continuous Continuous Continuous Continuous   Liters -- _2 Treadmill   MPH -- 1.3 1.3 -- 1.2   Grade -- 0 0 -- 1   Minutes -- 15 15 -- 15   METs -- 2 2 -- 2.08     NuStep   Level -- _3 Minutes -- _4 METs -- 1.8 2 1.8 1.6     Arm Ergometer   Level -- -- -- -- 1   Minutes -- -- -- -- 15   METs -- -- -- -- 2     T5 Nustep   Level -- -- 1 -- 1   Minutes -- -- 15 -- 15   METs -- -- -- -- 1.6     Biostep-RELP   Level -- -- 1 -- --   Minutes -- -- 15 -- --   METs -- -- 2 -- --     Home Exercise Plan   Plans to continue exercise  at -- -- Home (comment)  walking, treadmill Home (comment)  walking, treadmill Home (comment)  walking, treadmill   Frequency -- -- Add 2 additional days to program exercise sessions. Add 2 additional days to program exercise sessions. Add 2 additional days to program exercise sessions.   Initial Home Exercises Provided -- -- 04/06/21 04/06/21 04/06/21    Row Name 05/24/21 0900 06/05/21 1600           Response to Exercise   Blood Pressure (Admit) 132/72 122/62      Blood Pressure (Exercise) -- 136/74      Blood Pressure (Exit) 118/74 112/60      Heart Rate (Admit) 105 bpm 102 bpm      Heart Rate (Exercise) 16 bpm 120 bpm      Heart Rate (Exit) 109 bpm 106 bpm      Oxygen Saturation (Admit) 95 % 95 %      Oxygen Saturation (Exercise) 96 % 86 %      Oxygen Saturation (Exit) 96 % 95 %      Rating of Perceived Exertion  (Exercise) 13 16      Perceived Dyspnea (Exercise) 3 4      Symptoms SOB SOB      Duration Continue with 30 min of aerobic exercise without signs/symptoms of physical distress. Continue with 30 min of aerobic exercise without signs/symptoms of physical distress.      Intensity THRR unchanged THRR unchanged             Progression   Progression Continue to progress workloads to maintain intensity without signs/symptoms of physical distress. Continue to progress workloads to maintain intensity without signs/symptoms of physical distress.      Average METs 2 2.1             Resistance Training   Training Prescription Yes Yes      Weight 3 lb 3 lb      Reps 10-15 10-15             Interval Training   Interval Training No No             Oxygen   Oxygen Continuous Continuous      Liters 2 2             Treadmill   MPH 1.2 1.2      Grade 1 1      Minutes 15 15      METs 2.08 2.08             NuStep   Level -- 4      Minutes -- 15      METs -- 2             Arm Ergometer   Level 1 --      Minutes 15 --      METs 1.9 --             T5 Nustep   Level -- 1      Minutes -- 15      METs -- 2             Home Exercise Plan   Plans to continue exercise at Home (comment)  walking, treadmill Home (comment)  walking, treadmill      Frequency Add 2 additional days to program exercise sessions. Add 2 additional days to program exercise sessions.      Initial Home Exercises Provided 04/06/21 04/06/21  Oxygen   Maintain Oxygen Saturation 88% or higher 88% or higher               Exercise Comments:   Exercise Comments     Row Name 03/13/21 1418 06/14/21 1405         Exercise Comments First full day of exercise!  Patient was oriented to gym and equipment including functions, settings, policies, and procedures.  Patient's individual exercise prescription and treatment plan were reviewed.  All starting workloads were established based on the results of the  6 minute walk test done at initial orientation visit.  The plan for exercise progression was also introduced and progression will be customized based on patient's performance and goals. Anna Mooney graduated today from  rehab with 36 sessions completed.  Details of the patient's exercise prescription and what she needs to do in order to continue the prescription and progress were discussed with patient.  Patient was given a copy of prescription and goals.  Patient verbalized understanding.  Anna Mooney plans to continue to exercise by walking on the treadmill at home.               Exercise Goals and Review:   Exercise Goals     Row Name 03/13/21 1409             Exercise Goals   Increase Physical Activity Yes       Intervention Provide advice, education, support and counseling about physical activity/exercise needs.;Develop an individualized exercise prescription for aerobic and resistive training based on initial evaluation findings, risk stratification, comorbidities and participant's personal goals.       Expected Outcomes Short Term: Attend rehab on a regular basis to increase amount of physical activity.;Long Term: Add in home exercise to make exercise part of routine and to increase amount of physical activity.;Long Term: Exercising regularly at least 3-5 days a week.       Increase Strength and Stamina Yes       Intervention Provide advice, education, support and counseling about physical activity/exercise needs.;Develop an individualized exercise prescription for aerobic and resistive training based on initial evaluation findings, risk stratification, comorbidities and participant's personal goals.       Expected Outcomes Short Term: Increase workloads from initial exercise prescription for resistance, speed, and METs.;Short Term: Perform resistance training exercises routinely during rehab and add in resistance training at home;Long Term: Improve cardiorespiratory fitness, muscular endurance  and strength as measured by increased METs and functional capacity (6MWT)       Able to understand and use rate of perceived exertion (RPE) scale Yes       Intervention Provide education and explanation on how to use RPE scale       Expected Outcomes Short Term: Able to use RPE daily in rehab to express subjective intensity level;Long Term:  Able to use RPE to guide intensity level when exercising independently       Able to understand and use Dyspnea scale Yes       Intervention Provide education and explanation on how to use Dyspnea scale       Expected Outcomes Short Term: Able to use Dyspnea scale daily in rehab to express subjective sense of shortness of breath during exertion;Long Term: Able to use Dyspnea scale to guide intensity level when exercising independently       Knowledge and understanding of Target Heart Rate Range (THRR) Yes       Intervention Provide education and explanation of THRR including how the  numbers were predicted and where they are located for reference       Expected Outcomes Short Term: Able to state/look up THRR;Long Term: Able to use THRR to govern intensity when exercising independently;Short Term: Able to use daily as guideline for intensity in rehab       Able to check pulse independently Yes       Intervention Provide education and demonstration on how to check pulse in carotid and radial arteries.;Review the importance of being able to check your own pulse for safety during independent exercise       Expected Outcomes Short Term: Able to explain why pulse checking is important during independent exercise       Understanding of Exercise Prescription Yes       Intervention Provide education, explanation, and written materials on patient's individual exercise prescription       Expected Outcomes Short Term: Able to explain program exercise prescription;Long Term: Able to explain home exercise prescription to exercise independently       Improve claudication pain  toleration; Improve walking ability Yes       Intervention Participate in PAD/SET Rehab 2-3 days a week, walking at home as part of exercise prescription;Attend education sessions to aid in risk factor modification and understanding of disease process       Expected Outcomes Short Term: Improve walking distance/time to onset of claudication pain;Long Term: Improve score of PAD questionnaires                Exercise Goals Re-Evaluation :  Exercise Goals Re-Evaluation     Row Name 03/13/21 1418 03/27/21 1012 04/06/21 1402 04/10/21 1524 04/25/21 1235     Exercise Goal Re-Evaluation   Exercise Goals Review Increase Physical Activity;Knowledge and understanding of Target Heart Rate Range (THRR);Able to understand and use rate of perceived exertion (RPE) scale;Understanding of Exercise Prescription;Increase Strength and Stamina;Able to understand and use Dyspnea scale;Able to check pulse independently Increase Physical Activity;Increase Strength and Stamina Increase Physical Activity;Increase Strength and Stamina Increase Physical Activity;Increase Strength and Stamina;Understanding of Exercise Prescription Increase Physical Activity;Increase Strength and Stamina   Comments Reviewed RPE and dyspnea scales, THR and program prescription with pt today.  Pt voiced understanding and was given a copy of goals to take home. Anna Mooney is doing well her first couple of weeks of being at rehab. She has already increased to level 3 on the T4 NuStep. She is building up tolerance on the treadmill and oxygen saturations are staying above 88% during exercise. Will continue to monitor. Reviewed home exercise with pt today.  Pt plans to walk TM and use bike for exercise.  Reviewed THR, pulse, RPE, sign and symptoms, pulse oximetery and when to call 911 or MD.  Also discussed weather considerations and indoor options.  Pt voiced understanding. Anna Mooney is doing well in rehab.  She is enjoying the treadmill and up to the full 15  minutes on there.  We will continue to monitor her progress. Anna Mooney has somedays that are harder breathing and does only seated equipment.  Oxygen stays in the 90s.  Staff will monitor progress.   Expected Outcomes Short: Use RPE daily to regulate intensity. Long: Follow program prescription in THR. Short: Continue to increase speed on treadmill Long: Continue to increase overall strength and stamina Short: monitor HR an dO2 while exercising at home Long: continue to build stamina Short: Make sure to put in weight and maintain spm on steppers Long: continue to improve stamia Short:  continue  to wok on TM Long:  build stamina and improve SOB    Row Name 05/11/21 1137 05/11/21 1345 05/24/21 0955 05/29/21 1425       Exercise Goal Re-Evaluation   Exercise Goals Review Increase Physical Activity;Increase Strength and Stamina Increase Physical Activity;Increase Strength and Stamina;Understanding of Exercise Prescription Increase Physical Activity;Increase Strength and Stamina Increase Physical Activity;Increase Strength and Stamina    Comments Anna Mooney has added incline to her treadmill, up to 1%! She should continue to build up her speed as tolerated. Her oxygen saturation is maintaining in stable levels Anna Mooney is doing well in rehab.  She feels that she does better at home without the mask versus in class with it.  She will usually use the treadmill for 20-30 min. She is feeling stronger. Anna Mooney continues to do well.  She attends consistently. Oxygen stays in the 90s during sessions. Anna Mooney has a TMand bike at home.  She did 35 minutes atr home wihtout oxygen (she forgot) and didnt feel it dropped althoug she didnt check    Expected Outcomes Short: Continue to build up workloads on treadmill as tolerated Long: Continue to increase overall MET level Short: Continue to exercise on off days Long: Continue to improve stamina. Short: maintain consistent attendance Long: continue to build stamina Short: use oxygen when  exercising at home Long:  use oxygen as prescribed at home             Discharge Exercise Prescription (Final Exercise Prescription Changes):  Exercise Prescription Changes - 06/05/21 1600       Response to Exercise   Blood Pressure (Admit) 122/62    Blood Pressure (Exercise) 136/74    Blood Pressure (Exit) 112/60    Heart Rate (Admit) 102 bpm    Heart Rate (Exercise) 120 bpm    Heart Rate (Exit) 106 bpm    Oxygen Saturation (Admit) 95 %    Oxygen Saturation (Exercise) 86 %    Oxygen Saturation (Exit) 95 %    Rating of Perceived Exertion (Exercise) 16    Perceived Dyspnea (Exercise) 4    Symptoms SOB    Duration Continue with 30 min of aerobic exercise without signs/symptoms of physical distress.    Intensity THRR unchanged      Progression   Progression Continue to progress workloads to maintain intensity without signs/symptoms of physical distress.    Average METs 2.1      Resistance Training   Training Prescription Yes    Weight 3 lb    Reps 10-15      Interval Training   Interval Training No      Oxygen   Oxygen Continuous    Liters 2      Treadmill   MPH 1.2    Grade 1    Minutes 15    METs 2.08      NuStep   Level 4    Minutes 15    METs 2      T5 Nustep   Level 1    Minutes 15    METs 2      Home Exercise Plan   Plans to continue exercise at Home (comment)   walking, treadmill   Frequency Add 2 additional days to program exercise sessions.    Initial Home Exercises Provided 04/06/21      Oxygen   Maintain Oxygen Saturation 88% or higher             Nutrition:  Target Goals: Understanding of nutrition guidelines, daily  intake of sodium <1570m, cholesterol <2043m calories 30% from fat and 7% or less from saturated fats, daily to have 5 or more servings of fruits and vegetables.  Education: All About Nutrition: -Group instruction provided by verbal, written material, interactive activities, discussions, models, and posters to present  general guidelines for heart healthy nutrition including fat, fiber, MyPlate, the role of sodium in heart healthy nutrition, utilization of the nutrition label, and utilization of this knowledge for meal planning. Follow up email sent as well. Written material given at graduation. [facility]  Date 03/29/21  [facility] Code 1- Verbalizes Understanding       Biometrics:  Pre Biometrics - 03/13/21 1410       Pre Biometrics   Height _0  (1.676 m)    Weight 139 lb 14.4 oz (63.5 kg)    BMI (Calculated) 22.59    Single Leg Stand 6.34 seconds             Post Biometrics - 06/01/21 1352        Post  Biometrics   Height _1  (1.676 m)    Weight 135 lb 9.6 oz (61.5 kg)    BMI (Calculated) 21.9    Single Leg Stand 10 seconds             Nutrition Therapy Plan and Nutrition Goals:  Nutrition Therapy & Goals - 04/03/21 1511       Nutrition Therapy   Diet Heart healthy, low Na, pulmonary friendly    Protein (specify units) 75g    Fiber 25 grams    Whole Grain Foods 3 servings    Saturated Fats 12 max. grams    Fruits and Vegetables 8 servings/day    Sodium 1.5 grams      Personal Nutrition Goals   Nutrition Goal ST: add in protein snack like apple with peanut butter, pt is on the fence regarding protein shake, wants to try out prepared meals LT: limit Na < 1.5g/day, eat enough protein    Comments Anna Mooney reports having a poor appetite and will make herself eat. She used to salt everything she ate, but no longer does this. She continues to go out to eat 2x/day breakfast and lunch. Anna Mooney her favorite food- for breakfast yesterday she had bacon and egg and an apple and for dinner a salad - she threw out the fried chicken. She enjoys beans and does not care for meat much anymore. She reports not wanting to prepare meals and would be interested in a meal kit. Discussed some options she  could try and the pros/cons of doing so. Discussed heart healthy and pulmonary friendly eating.      Intervention Plan   Intervention Prescribe, educate and counsel regarding individualized specific dietary modifications aiming towards targeted core components such as weight, hypertension, lipid management, diabetes, heart failure and other comorbidities.;Nutrition handout(s) given to patient.    Expected Outcomes Short Term Goal: Understand basic principles of dietary content, such as calories, fat, sodium, cholesterol and nutrients.;Short Term Goal: A plan has been developed with personal nutrition goals set during dietitian appointment.;Long Term Goal: Adherence to prescribed nutrition plan.             Nutrition Assessments:  MEDIFICTS Score Key: ?70 Need to make dietary changes  40-70 Heart Healthy Diet ? 40 Therapeutic Level Cholesterol Diet  [facility]  Picture Your Plate Total Score on Admission 40  Picture Your Plate Total Score on Discharge 58      Picture Your Plate Scores: <02 Unhealthy dietary pattern with much room for improvement. 41-50 Dietary pattern unlikely to meet recommendations for good health and room for improvement. 51-60 More healthful dietary pattern, with some room for improvement.  >60 Healthy dietary pattern, although there may be some specific behaviors that could be improved.   Nutrition Goals Re-Evaluation:  Nutrition Goals Re-Evaluation     [place] Name 05/11/21 1350 05/29/21 1421           Goals   Nutrition Goal ST: add in protein snack like apple with peanut butter, pt is on the fence regarding protein shake, wants to try out prepared meals LT: limit Na < 1.5g/day, eat enough protein --      Comment Anna Mooney is doing well in rehab. Her diet is going okay.  She still is lacking an appetite and has tried the protein drinks and bars.  She just has to remember to drink/eat them  versus fast food.  She is trying to watch her sodium intake more, but she still loves her salt. [place] out [employer] and knows that snot the healthiest option. She does try to avoid fast foods      Expected Outcome Short: Aim for more protein Long: Continue to watch salt Short: conintue to limit freid foods Long: maintain healthy eating patterns               Nutrition Goals Discharge (Final Nutrition Goals Re-Evaluation):  Nutrition Goals Re-Evaluation - 05/29/21 1421       Goals   Comment [place] [employer] and knows that snot the healthiest option. She does try to avoid fast foods    Expected Outcome Short: conintue to limit freid foods Long: maintain healthy eating patterns             Psychosocial: Target Goals: Acknowledge presence or absence of significant depression and/or stress, maximize coping skills, provide positive support system. Participant is able to verbalize types and ability to use techniques and skills needed for reducing stress and depression.   Education: Stress, Anxiety, and Depression - Group verbal and visual presentation to define topics covered.  Reviews how body is impacted by stress, anxiety, and depression.  Also discusses healthy ways to reduce stress and to treat/manage anxiety and depression.  Written material given at graduation. Flowsheet Row Pulmonary Rehab from 05/17/2021 in [facility] and Pulmonary Rehab  Date 04/26/21  Educator AS  Instruction Review Code 1- Verbalizes Understanding       Education: Sleep Hygiene -Provides group verbal and written instruction about how sleep can affect your health.  Define sleep hygiene, discuss sleep cycles and impact of sleep habits. Review good sleep hygiene tips.    Initial Review & Psychosocial Screening:  Initial Psych Review & Screening - 03/03/21 1312       Initial Review   Current issues with Current Stress Concerns;Current Sleep Concerns    Source of Stress Concerns Unable to participate  in former interests or hobbies;Unable to perform yard/household activities      [place]? Yes   nieces, nephews, brother     Barriers   Psychosocial barriers to participate in program There are no identifiable barriers or psychosocial needs.      Screening Interventions   Interventions Encouraged to exercise;Provide feedback about the scores to participant;To provide support and resources  with identified psychosocial needs    Expected Outcomes Short Term goal: Utilizing psychosocial counselor, staff and physician to assist with identification of specific Stressors or current issues interfering with healing process. Setting desired goal for each stressor or current issue identified.;Long Term Goal: Stressors or current issues are controlled or eliminated.;Short Term goal: Identification and review with participant of any Quality of Life or Depression concerns found by scoring the questionnaire.;Long Term goal: The participant improves quality of Life and PHQ9 Scores as seen by post scores and/or verbalization of changes             Quality of Life Scores:  Scores of 19 and below usually indicate a poorer quality of life in these areas.  A difference of  2-3 points is a clinically meaningful difference.  A difference of 2-3 points in the total score of the Quality of Life Index has been associated with significant improvement in overall quality of life, self-image, physical symptoms, and general health in studies assessing change in quality of life.  PHQ-9: Recent Review Flowsheet Data     Depression screen [facility] 2/9 06/01/2021 04/03/2021 03/09/2021   Decreased Interest 1 0 1   Down, Depressed, Hopeless 1 0 0   PHQ - 2 Score 2 0 1   Altered sleeping _0 Tired, decreased energy _1 Change in appetite _2 Feeling bad or failure about yourself  0 0 0   Trouble concentrating 0 0 0   Moving slowly or fidgety/restless 0 0 0   Suicidal thoughts 0 0 0    PHQ-9 Score _3 Difficult doing work/chores Somewhat difficult Not difficult at all -      Interpretation of Total Score  Total Score Depression Severity:  1-4 = Minimal depression, 5-9 = Mild depression, 10-14 = Moderate depression, 15-19 = Moderately severe depression, 20-27 = Severe depression   Psychosocial Evaluation and Intervention:  Psychosocial Evaluation - 03/03/21 1319       Psychosocial Evaluation & Interventions   Interventions Encouraged to exercise with the program and follow exercise prescription    Comments Ms. Leuthold is coming to pulmonary rehab for COPD in hopes this will help her breathing. She lives alone, with nieces, nephews and a brother close by, and wishes to maintain her independence as long as possible. She started wearing oxygen in March and is compliant with it. Her appetite is a struggle in that when she finally is hungry and makes food she only eats a little of it. She states she feels like her stress has gone down now that one of her brothers is no longer suffering with dementia- which was very difficult to watch for her. She does wish she could do more than just sit at home and is hopeful this program will change that.    Expected Outcomes Short: attend pulmonary rehab for education and exercise. Long: develop positive health care habits.             Psychosocial Re-Evaluation:  Psychosocial Re-Evaluation     [place] Name 04/03/21 1417 05/11/21 1348 05/29/21 1419         Psychosocial Re-Evaluation   Current issues with -- Current Sleep Concerns;Current Stress Concerns Current Stress Concerns;Current Sleep Concerns     Comments Reviewed patient health questionnaire (PHQ-9) with patient for follow up. Previously, patients score indicated signs/symptoms of depression.  Reviewed to see if patient is improving symptom wise while in program.  Score improved/declined and patient states that it is because she has been able to exercise more and has more  energy. Anna Mooney is doing well in rehab.  She is feeling good mentally.  She did tried to talk to her doctor about her sleep and they put her on something that did not work for her.  She is between physcians as hers left recently.  She really wants to try something else to help. Anna Mooney reports she has no symtpoms of stress anxiety or depression.  She sleeps well and can go back to sleep easily if she wakes up.     Expected Outcomes Short: Continue to attend [facility] regularly for regular exercise and social engagement. Long: Continue to improve symptoms and manage a positive mental state. Short: Try to find new primary care physcian Long: Continue to stay positive. Short: maintain good sleep patterns Long: keep positive outlook     Interventions Encouraged to attend Pulmonary Rehabilitation for the exercise Encouraged to attend Pulmonary Rehabilitation for the exercise --     Continue Psychosocial Services  No Follow up required -- --              Psychosocial Discharge (Final Psychosocial Re-Evaluation):  Psychosocial Re-Evaluation - 05/29/21 1419       Psychosocial Re-Evaluation   Current issues with Current Stress Concerns;Current Sleep Concerns    Comments Anna Mooney reports she has no symtpoms of stress anxiety or depression.  She sleeps well and can go back to sleep easily if she wakes up.    Expected Outcomes Short: maintain good sleep patterns Long: keep positive outlook             Education: Education Goals: Education classes will be provided on a weekly basis, covering required topics. Participant will state understanding/return demonstration of topics presented.  Learning Barriers/Preferences:  Learning Barriers/Preferences - 03/03/21 1314       Learning Barriers/Preferences   Learning Barriers None    Learning Preferences None             General Pulmonary Education Topics:  Infection Prevention: - Provides verbal and written material to individual with discussion of  infection control including proper hand washing and proper equipment cleaning during exercise session. Flowsheet Row Pulmonary Rehab from 05/17/2021 in [facility] Cardiac and Pulmonary Rehab  Date 03/09/21  Educator AS  Instruction Review Code 1- Verbalizes Understanding       Falls Prevention: - Provides verbal and written material to individual with discussion of falls prevention and safety.   Chronic Lung Disease Review: - Group verbal instruction with posters, models, PowerPoint presentations and videos,  to review new updates, new respiratory medications, new advancements in procedures and treatments. Providing information on websites and "800" numbers for continued self-education. Includes information about supplement oxygen, available portable oxygen systems, continuous and intermittent flow rates, oxygen safety, concentrators, and Medicare reimbursement for oxygen. Explanation of Pulmonary Drugs, including class, frequency, complications, importance of spacers, rinsing mouth after steroid MDI's, and proper cleaning methods for nebulizers. Review of basic lung anatomy and physiology related to function, structure, and complications of lung disease. Review of risk factors. Discussion about methods for diagnosing sleep apnea and types of masks and machines for OSA. Includes a review of the use of types of environmental controls: home humidity, furnaces, filters, dust mite/pet prevention, HEPA vacuums. Discussion about weather changes, air quality and the benefits of nasal washing. Instruction on Warning signs, infection symptoms, calling MD promptly, preventive modes, and value of vaccinations. Review of effective  airway clearance, coughing and/or vibration techniques. Emphasizing that all should Create an Action Plan. Written material given at graduation. Flowsheet Row Pulmonary Rehab from 05/17/2021 in [facility] Cardiac and Pulmonary Rehab  Date 04/19/21  Educator [facility]  Instruction Review Code 1-  Verbalizes Understanding       AED/CPR: - Group verbal and written instruction with the use of models to demonstrate the basic use of the AED with the basic ABC's of resuscitation.    Anatomy and Cardiac Procedures: - Group verbal and visual presentation and models provide information about basic cardiac anatomy and function. Reviews the testing methods done to diagnose heart disease and the outcomes of the test results. Describes the treatment choices: Medical Management, Angioplasty, or Coronary Bypass Surgery for treating various heart conditions including Myocardial Infarction, Angina, Valve Disease, and Cardiac Arrhythmias.  Written material given at graduation. Flowsheet Row Pulmonary Rehab from 05/17/2021 in [facility] Cardiac and Pulmonary Rehab  Date 03/15/21  Educator [facility]  Instruction Review Code 1- Verbalizes Understanding       Medication Safety: - Group verbal and visual instruction to review commonly prescribed medications for heart and lung disease. Reviews the medication, class of the drug, and side effects. Includes the steps to properly store meds and maintain the prescription regimen.  Written material given at graduation.   Other: -Provides group and verbal instruction on various topics (see comments)   Knowledge Questionnaire Score:  Knowledge Questionnaire Score - 05/31/21 1444       Knowledge Questionnaire Score   Post Score 18/18              Core Components/Risk Factors/Patient Goals at Admission:  Personal Goals and Risk Factors at Admission - 03/13/21 1405       Core Components/Risk Factors/Patient Goals on Admission   Improve shortness of breath with ADL's Yes    Intervention Provide education, individualized exercise plan and daily activity instruction to help decrease symptoms of SOB with activities of daily living.    Expected Outcomes Short Term: Improve cardiorespiratory fitness to achieve a reduction of symptoms when performing ADLs;Long Term:  Be able to perform more ADLs without symptoms or delay the onset of symptoms    Increase knowledge of respiratory medications and ability to use respiratory devices properly  Yes    Intervention Provide education and demonstration as needed of appropriate use of medications, inhalers, and oxygen therapy.    Expected Outcomes Short Term: Achieves understanding of medications use. Understands that oxygen is a medication prescribed by physician. Demonstrates appropriate use of inhaler and oxygen therapy.;Long Term: Maintain appropriate use of medications, inhalers, and oxygen therapy.             Education:Diabetes - Individual verbal and written instruction to review signs/symptoms of diabetes, desired ranges of glucose level fasting, after meals and with exercise. Acknowledge that pre and post exercise glucose checks will be done for 3 sessions at entry of program.   Know Your Numbers and Heart Failure: - Group verbal and visual instruction to discuss disease risk factors for cardiac and pulmonary disease and treatment options.  Reviews associated critical values for Overweight/Obesity, Hypertension, Cholesterol, and Diabetes.  Discusses basics of heart failure: signs/symptoms and treatments.  Introduces Heart Failure Zone chart for action plan for heart failure.  Written material given at graduation. Flowsheet Row Pulmonary Rehab from 05/17/2021 in [facility] Cardiac and Pulmonary Rehab  Date 04/12/21  Educator [facility]  Instruction Review Code 1- Verbalizes Understanding       Core Components/Risk Factors/Patient  Goals Review:   Goals and Risk Factor Review     Row Name 04/06/21 1343 05/11/21 1352 05/29/21 1417         Core Components/Risk Factors/Patient Goals Review   Personal Goals Review Improve shortness of breath with ADL's;Increase knowledge of respiratory medications and ability to use respiratory devices properly. Improve shortness of breath with ADL's;Increase knowledge of respiratory  medications and ability to use respiratory devices properly.;Weight Management/Obesity Improve shortness of breath with ADL's;Increase knowledge of respiratory medications and ability to use respiratory devices properly.     Review Anna Mooney says she has seen some improvement in her breathing - not consistently.  She feels her energy is better.  She does practice PLB.  She doesnt check oxygen at home but plans to get one.  She does take medications as directed. Anna Mooney is doing well in rehab. She has lost some weight and does not want to lose any more.  She is still struggling with her breathing.  Some days are good and some are not so good.  She does know her numbers and stamina are getting better, but still feels SOB.  She is doing well with her meds and using her inhalers as she is supposed to. Anna Mooney says her brathing is sometimes good and sometimes not.  She can tell her ADLs are easier now and she has a little more stamina.  She has a TM and bike at home to use.     Expected Outcomes Short: continue to take meds and work on PLB Long:  improve breathing with ADLs Short: Work on gaining weight and remembering to eat Long: Continue to work on breathing. Short:  complete LW Long: maintain exercise on her own              Core Components/Risk Factors/Patient Goals at Discharge (Final Review):   Goals and Risk Factor Review - 05/29/21 1417       Core Components/Risk Factors/Patient Goals Review   Personal Goals Review Improve shortness of breath with ADL's;Increase knowledge of respiratory medications and ability to use respiratory devices properly.    Review Anna Mooney says her brathing is sometimes good and sometimes not.  She can tell her ADLs are easier now and she has a little more stamina.  She has a TM and bike at home to use.    Expected Outcomes Short:  complete LW Long: maintain exercise on her own             ITP Comments:  ITP Comments     Row Name 03/03/21 1324 03/13/21 1413 03/13/21 1417  03/29/21 1121 04/26/21 0753   ITP Comments Initial telephone orientation completed. Diagnosis can be found in [facility] 5/16. EP orientation scheduled for Thursday 6/23 at 2pm. Completed 6MWT and gym orientation. Initial ITP created and sent for review to Dr Ottie Glazier, Medical Director. First full day of exercise!  Patient was oriented to gym and equipment including functions, settings, policies, and procedures.  Patient's individual exercise prescription and treatment plan were reviewed.  All starting workloads were established based on the results of the 6 minute walk test done at initial orientation visit.  The plan for exercise progression was also introduced and progression will be customized based on patient's performance and goals. 30 Day review completed. Medical Director ITP review done, changes made as directed, and signed approval by Medical Director.   New to program 30 Day review completed. Medical Director ITP review done, changes made as directed, and signed approval by  Medical Director.    [place] Name 05/24/21 0719 06/14/21 1404         ITP Comments 30 Day review completed. Medical Director ITP review done, changes made as directed, and signed approval by Medical Director. Kathaleya graduated today from  rehab with 36 sessions completed.  Details of the patient's exercise prescription and what she needs to do in order to continue the prescription and progress were discussed with patient.  Patient was given a copy of prescription and goals.  Patient verbalized understanding.  Mattingly plans to continue to exercise by walking on the treadmill at home.               Comments: discharge ITP

## 2021-06-14 NOTE — Progress Notes (Signed)
Daily Session Note  Patient Details  Name: Soraida Vickers MRN: [id number] Date of Birth: 08-04-1946 Referring Provider:   Flowsheet Row Pulmonary Rehab from 03/13/2021 in [facility] Cardiac [facility] Pul[facility]  Referring Provider [place]       Encounter Date: 06/14/2021  Check In:  Session Check In - 06/14/21 1403       Check-In   Supervising physician immediately available to respond to emergencies See telemetry face sheet for immediately available ER MD    Location [facility]-Cardiac & Pulmonary Rehab    Staff Present Birdie Sons, MPA, Nino Glow, MS, ASCM CEP, Exercise Physiologist;Joseph Tessie Fass, [place]    Virtual Visit No    Medication changes reported     No    Fall or balance concerns reported    No    Tobacco Cessation No Change    Warm-up and Cool-down Performed on first and last piece of equipment    Resistance Training Performed Yes    VAD Patient? No    PAD/SET Patient? No      Pain Assessment   Currently in Pain? No/denies                Social History   Tobacco Use  Smoking Status Former   Types: Cigarettes   Quit date: 2000   Years since quitting: 22.7  Smokeless Tobacco Never    Goals Met:  Independence with exercise equipment Exercise tolerated well No report of concerns or symptoms today Strength training completed today  Goals Unmet:  Not Applicable  Comments:  Jammi graduated today from  rehab with 36 sessions completed.  Details of the patient's exercise prescription and what she needs to do in order to continue the prescription and progress were discussed with patient.  Patient was given a copy of prescription and goals.  Patient verbalized understanding.  Shandrea plans to continue to exercise by walking on the treadmill at home.    Dr. Emily Filbert is Medical Director for [place].  Dr. Ottie Glazier is Medical Director for [facility] Pulmonary Rehabilitation.

## 2022-02-11 IMAGING — MG DIGITAL SCREENING BILAT W/ TOMO W/ CAD
8 series · 9 of 24 positions shown · non-contrast
Comparison: None.

CLINICAL DATA: Screening.

EXAM:
DIGITAL SCREENING BILATERAL MAMMOGRAM WITH TOMO AND CAD

[L MLO synth-2D]
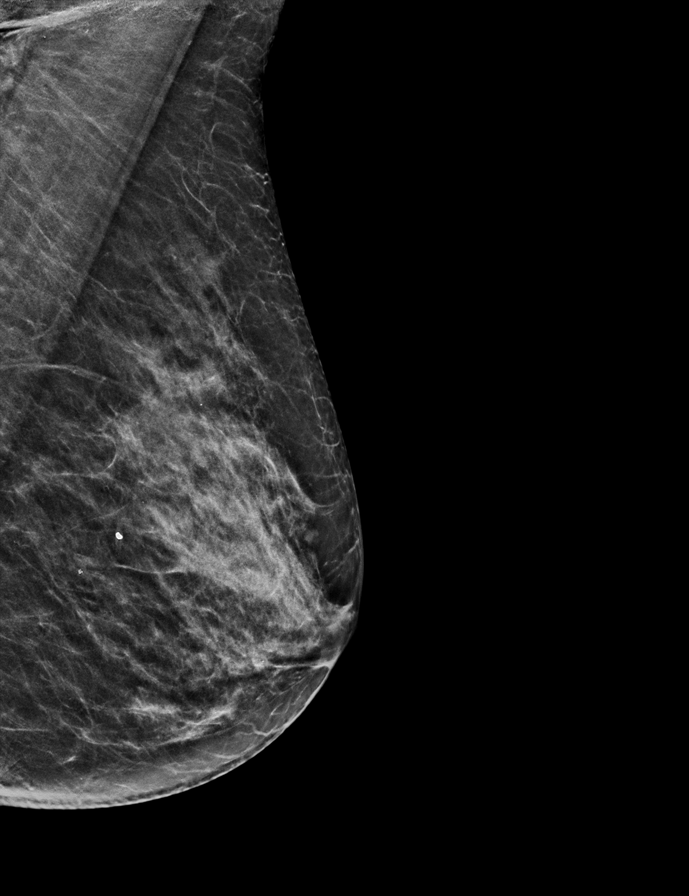

[R CC synth-2D]
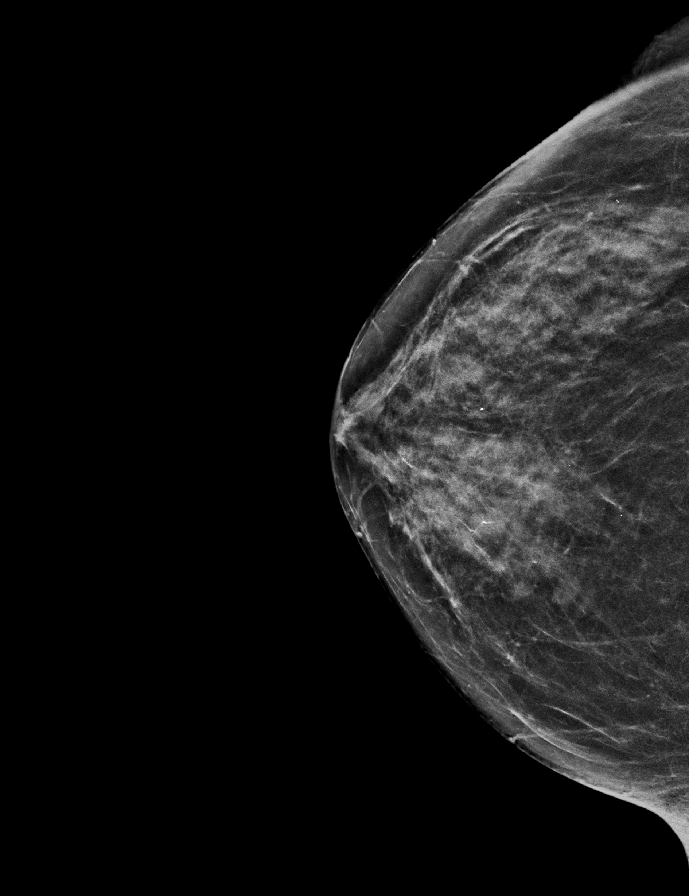

[R MLO synth-2D]
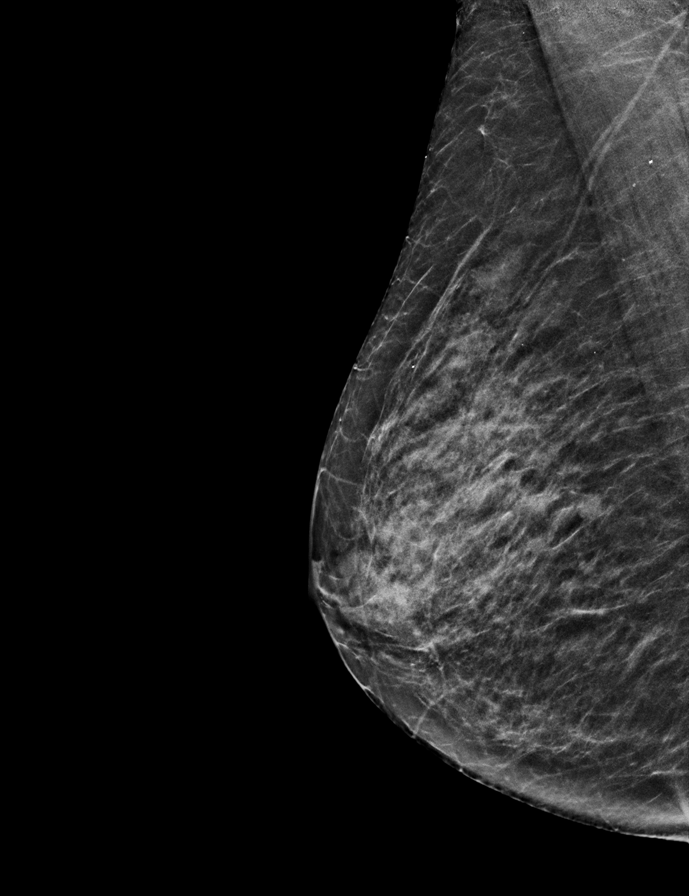

[L CC synth-2D]
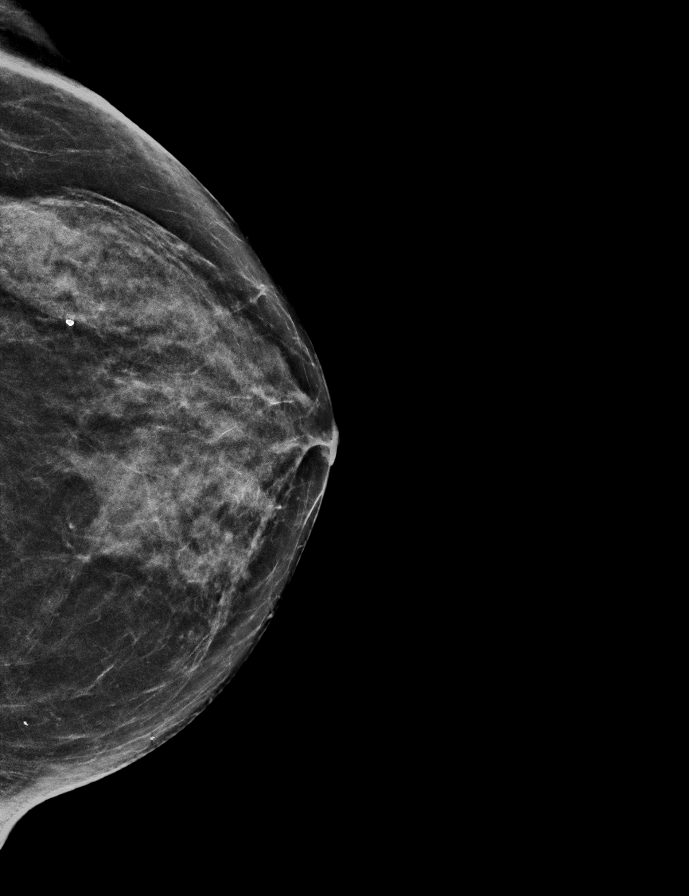

[R MLO tomo · 2 of 52 frames shown]
[frame 17/52]
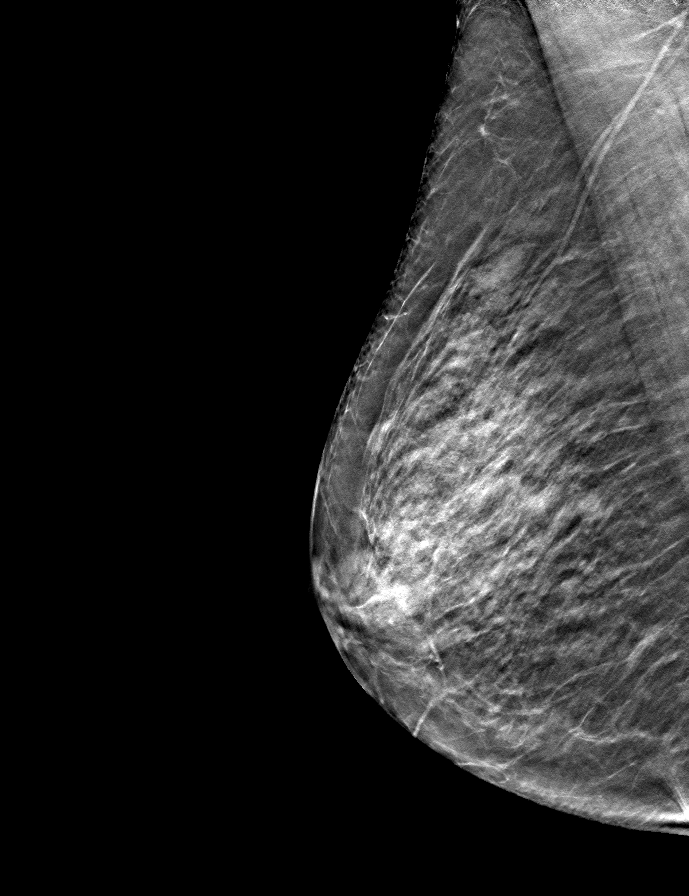
[frame 27/52]
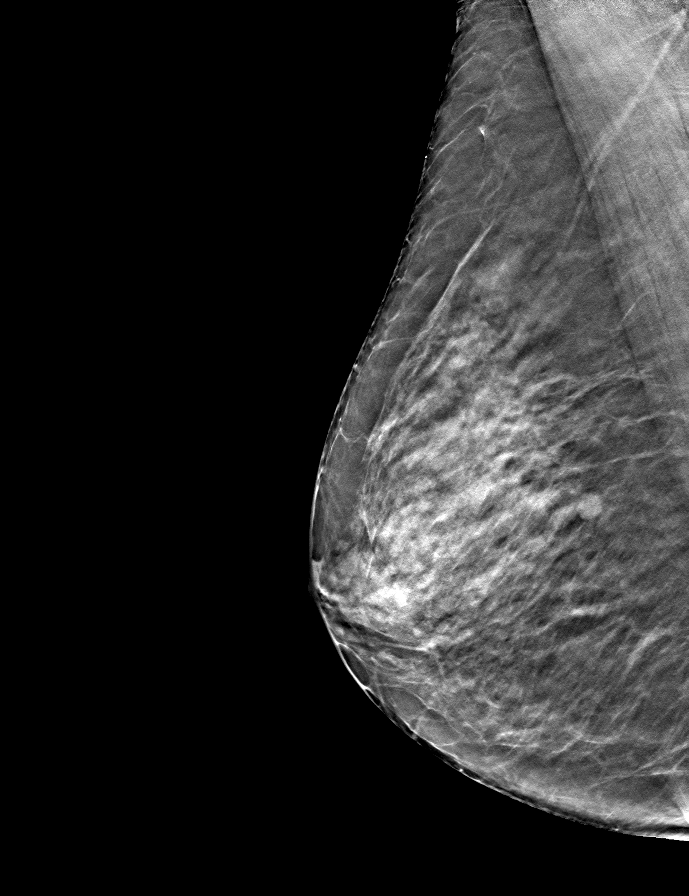

[R CC tomo · tomo slice 29/56.0]
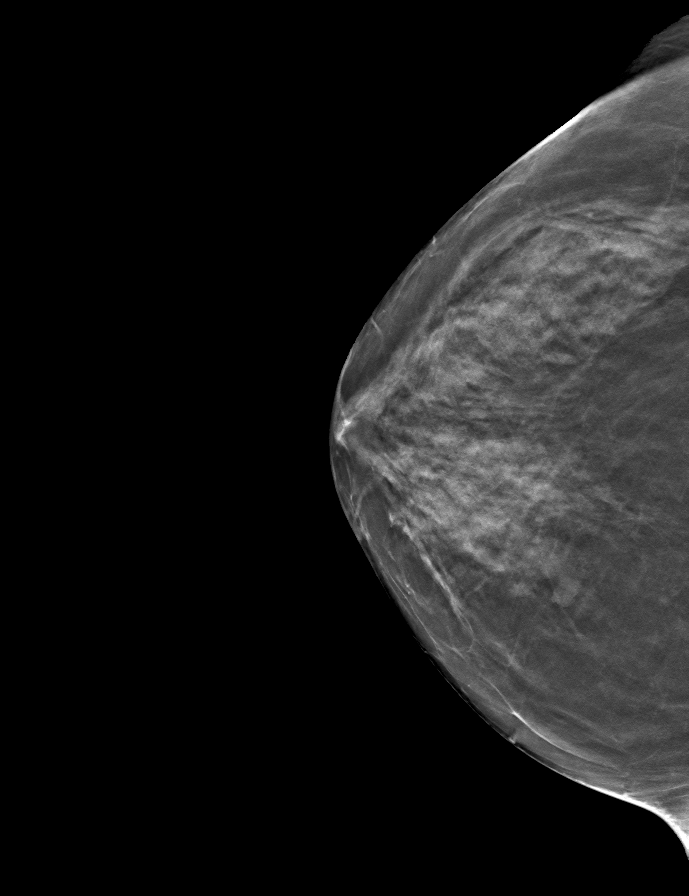

[L MLO tomo · tomo slice 27/53.0]
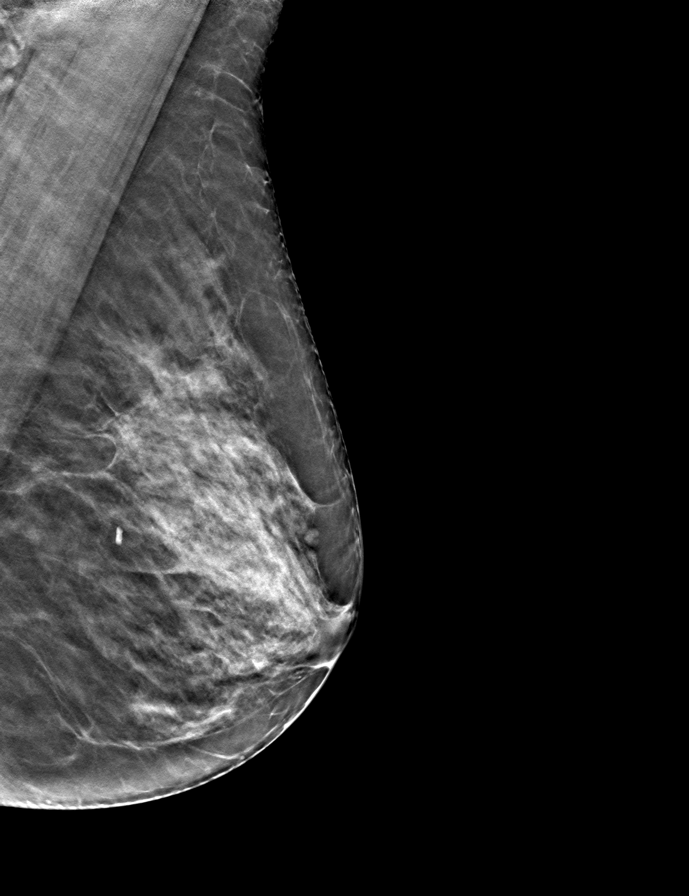

[L CC tomo · tomo slice 28/55.0]
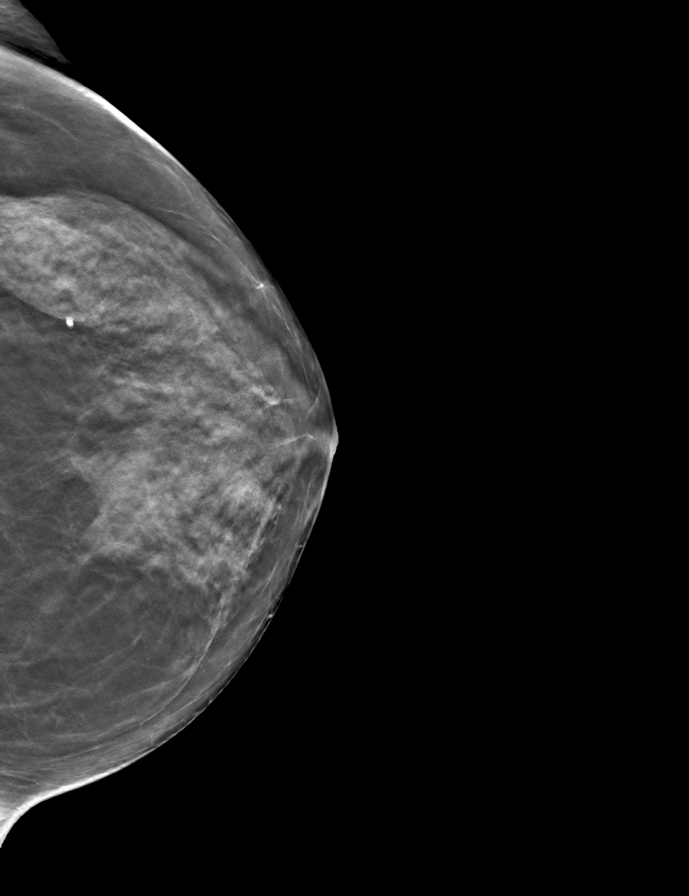

[9 of 24 positions shown; findings below may reference images not displayed]

ACR Breast Density Category c: The breast tissue is heterogeneously
dense, which may obscure small masses.
FINDINGS: In the right breast a possible mass requires further evaluation.

In the left breast possible distortion requires further evaluation.

Images were processed with CAD.
IMPRESSION: Further evaluation is suggested for possible mass in the right
breast.

Further evaluation is suggested for possible distortion in the left
breast.

RECOMMENDATION:
Diagnostic mammogram and possibly ultrasound of both breasts.
(Code:[id number])

The patient will be contacted regarding the findings, and additional
imaging will be scheduled.

BI-RADS CATEGORY  0: Incomplete. Need additional imaging evaluation
and/or prior mammograms for comparison.

## 2022-04-25 ENCOUNTER — Other Ambulatory Visit: Payer: Self-pay | Admitting: Internal Medicine

## 2022-04-25 DIAGNOSIS — Z1231 Encounter for screening mammogram for malignant neoplasm of breast: Secondary | ICD-10-CM

## 2022-05-18 ENCOUNTER — Ambulatory Visit
Admission: RE | Admit: 2022-05-18 | Discharge: 2022-05-18 | Disposition: A | Discharge status: Home / Self Care | Payer: Medicare Other | Source: Ambulatory Visit | Attending: Internal Medicine | Admitting: Internal Medicine

## 2022-05-18 DIAGNOSIS — Z1231 Encounter for screening mammogram for malignant neoplasm of breast: Secondary | ICD-10-CM | POA: Diagnosis present
